# Patient Record
Sex: Male | Born: 1935 | Race: White | Hispanic: No | Marital: Married | Smoking: Current every day smoker
Health system: Southern US, Community
[De-identification: ages and names within clinical notes are randomized; demographics above are authoritative.]

## PROBLEM LIST (undated history)

## (undated) ENCOUNTER — Emergency Department (HOSPITAL_COMMUNITY): Discharge status: Against Medical Advice | Payer: Medicare Other

## (undated) DIAGNOSIS — I10 Essential (primary) hypertension: Secondary | ICD-10-CM

## (undated) DIAGNOSIS — J449 Chronic obstructive pulmonary disease, unspecified: Secondary | ICD-10-CM

## (undated) DIAGNOSIS — M1990 Unspecified osteoarthritis, unspecified site: Secondary | ICD-10-CM

## (undated) DIAGNOSIS — I2510 Atherosclerotic heart disease of native coronary artery without angina pectoris: Secondary | ICD-10-CM

## (undated) DIAGNOSIS — Z992 Dependence on renal dialysis: Secondary | ICD-10-CM

## (undated) DIAGNOSIS — C61 Malignant neoplasm of prostate: Secondary | ICD-10-CM

## (undated) DIAGNOSIS — I499 Cardiac arrhythmia, unspecified: Secondary | ICD-10-CM

## (undated) DIAGNOSIS — I639 Cerebral infarction, unspecified: Secondary | ICD-10-CM

## (undated) DIAGNOSIS — C4490 Unspecified malignant neoplasm of skin, unspecified: Secondary | ICD-10-CM

## (undated) DIAGNOSIS — E785 Hyperlipidemia, unspecified: Secondary | ICD-10-CM

## (undated) DIAGNOSIS — N186 End stage renal disease: Secondary | ICD-10-CM

## (undated) DIAGNOSIS — K219 Gastro-esophageal reflux disease without esophagitis: Secondary | ICD-10-CM

## (undated) DIAGNOSIS — I4891 Unspecified atrial fibrillation: Secondary | ICD-10-CM

## (undated) HISTORY — DX: Hyperlipidemia, unspecified: E78.5

## (undated) HISTORY — DX: Malignant neoplasm of prostate: C61

## (undated) HISTORY — DX: Unspecified osteoarthritis, unspecified site: M19.90

## (undated) HISTORY — DX: Atherosclerotic heart disease of native coronary artery without angina pectoris: I25.10

## (undated) HISTORY — PX: BACK SURGERY: SHX140

## (undated) HISTORY — PX: HERNIA REPAIR: SHX51

## (undated) HISTORY — PX: APPENDECTOMY: SHX54

## (undated) HISTORY — DX: Essential (primary) hypertension: I10

## (undated) HISTORY — PX: AV FISTULA PLACEMENT: SHX1204

---

## 2000-11-03 ENCOUNTER — Ambulatory Visit: Admission: RE | Admit: 2000-11-03 | Discharge: 2000-11-03 | Payer: Self-pay | Admitting: Family Medicine

## 2001-11-20 ENCOUNTER — Ambulatory Visit (HOSPITAL_COMMUNITY): Admission: RE | Admit: 2001-11-20 | Discharge: 2001-11-20 | Payer: Self-pay | Admitting: Family Medicine

## 2001-11-20 ENCOUNTER — Encounter: Payer: Self-pay | Admitting: Family Medicine

## 2005-08-05 ENCOUNTER — Ambulatory Visit: Payer: Self-pay | Admitting: Cardiology

## 2005-09-06 ENCOUNTER — Ambulatory Visit: Payer: Self-pay | Admitting: Cardiology

## 2005-09-13 ENCOUNTER — Ambulatory Visit: Payer: Self-pay | Admitting: Cardiology

## 2005-10-15 ENCOUNTER — Ambulatory Visit: Payer: Self-pay | Admitting: Cardiology

## 2007-01-05 ENCOUNTER — Ambulatory Visit: Payer: Self-pay | Admitting: Cardiology

## 2008-02-28 ENCOUNTER — Ambulatory Visit: Payer: Self-pay | Admitting: Cardiology

## 2008-03-10 ENCOUNTER — Ambulatory Visit: Payer: Self-pay | Admitting: Cardiology

## 2008-03-10 ENCOUNTER — Encounter: Payer: Self-pay | Admitting: Cardiology

## 2008-03-10 DIAGNOSIS — I7389 Other specified peripheral vascular diseases: Secondary | ICD-10-CM | POA: Insufficient documentation

## 2008-03-10 DIAGNOSIS — E785 Hyperlipidemia, unspecified: Secondary | ICD-10-CM

## 2008-03-10 DIAGNOSIS — N184 Chronic kidney disease, stage 4 (severe): Secondary | ICD-10-CM

## 2008-03-10 DIAGNOSIS — I209 Angina pectoris, unspecified: Secondary | ICD-10-CM

## 2008-03-10 DIAGNOSIS — M1990 Unspecified osteoarthritis, unspecified site: Secondary | ICD-10-CM | POA: Insufficient documentation

## 2008-03-10 DIAGNOSIS — D631 Anemia in chronic kidney disease: Secondary | ICD-10-CM | POA: Insufficient documentation

## 2008-03-10 DIAGNOSIS — F17200 Nicotine dependence, unspecified, uncomplicated: Secondary | ICD-10-CM | POA: Insufficient documentation

## 2008-03-10 DIAGNOSIS — I4891 Unspecified atrial fibrillation: Secondary | ICD-10-CM

## 2008-03-10 DIAGNOSIS — Z8546 Personal history of malignant neoplasm of prostate: Secondary | ICD-10-CM

## 2008-03-10 DIAGNOSIS — I10 Essential (primary) hypertension: Secondary | ICD-10-CM

## 2008-03-10 DIAGNOSIS — I5023 Acute on chronic systolic (congestive) heart failure: Secondary | ICD-10-CM

## 2008-03-10 DIAGNOSIS — N039 Chronic nephritic syndrome with unspecified morphologic changes: Secondary | ICD-10-CM

## 2008-03-13 ENCOUNTER — Inpatient Hospital Stay (HOSPITAL_COMMUNITY): Admission: EM | Admit: 2008-03-13 | Discharge: 2008-03-15 | Payer: Self-pay | Admitting: Cardiology

## 2008-03-13 ENCOUNTER — Ambulatory Visit: Payer: Self-pay | Admitting: Cardiology

## 2008-03-14 ENCOUNTER — Ambulatory Visit: Payer: Self-pay | Admitting: Thoracic Surgery (Cardiothoracic Vascular Surgery)

## 2008-03-25 ENCOUNTER — Ambulatory Visit: Payer: Self-pay | Admitting: Cardiology

## 2010-08-10 ENCOUNTER — Ambulatory Visit: Payer: Self-pay | Admitting: Vascular Surgery

## 2010-08-21 NOTE — Consult Note (Signed)
NAME:  Derek Vincent, Derek Vincent NO.:  [id number]   MEDICAL RECORD NO.:  [id number]          PATIENT TYPE:  INP   LOCATION:  3705                         FACILITY:  [facility]   PHYSICIAN:  Salvatore Decent. Dorris Fetch, M.D.DATE OF BIRTH:  1924-03-08   DATE OF CONSULTATION:  03/14/2008  DATE OF DISCHARGE:                                 CONSULTATION   Derek Vincent is an 77 year old gentleman with a history of presumed  coronary artery disease.  He had a non-Q-wave myocardial infarction in  2007.  He was also diagnosed with paroxysmal atrial fibrillation at that  time.  He had a Cardiolite study, which showed no reversible defects.  He did refuse catheterization.  In 2008, the patient had heart failure  with bilateral pleural effusions and markedly elevated BNP.  He also had  a non-ST elevation then.  His creatinine was noted to be 2.9.  In  November of this year, he had episode of bilateral arm and chest pain  associated with elevated troponins.  An echocardiogram showed multiple  regional wall motion abnormalities and a decline in his EF from  essentially normal to about 40%.  He refused cardiac catheterization.  He saw Dr. Andee Lineman in the office on March 10, 2008, who strongly  encouraged him to have cardiac catheterization given his high risk for  significant coronary artery disease.  The patient stated that he had not  been having any chest pain or arm pain since his discharge from the  hospital, but finally consented to have a cardiac catheterization.  He  was admitted yesterday and treated with IV hydration and Mucomyst, and  today he underwent cardiac catheterization, which revealed severe two-  vessel coronary disease with a totally occluded right coronary.  There  is complex LAD diagonal disease involving the takeoff of a large first  diagonal branch.   PAST MEDICAL HISTORY:  Significant for presumed coronary artery disease,  paroxysmal atrial fibrillation.  He has refused  Coumadin.  Chronic  kidney disease with a creatinine of 2.9 to 3, dyslipidemia,  hypertension, degenerative joint disease, prostate cancer, and COPD with  ongoing tobacco abuse.   PAST SURGICAL HISTORY:  Significant for appendectomy, hernia repair, and  back surgery.   MEDICATIONS ON ADMISSION:  1. Imdur 60 mg daily.  2. Benicar 40 mg daily.  3. Lasix 40 mg daily.  4. Lipitor 80 mg daily.  5. Plavix 75 mg daily.  6. Toprol-XL 50 mg b.i.d.  7. Aspirin 325 mg daily.  8. K-Dur 10 mEq daily.   ALLERGIES:  He has no known drug allergies.   FAMILY HISTORY:  Significant for coronary artery disease.   SOCIAL HISTORY:  The patient is retired.  He lives at [place] with  his wife.  He states he has been smoking for 72 years about a pack a  day.   REVIEW OF SYSTEMS:  The patient denies any knowledge of kidney disease  prior to this admission.  He denies any chest pain or arm pain or  shortness of breath.  Since his last admission, he does get tired in  his  legs with walking.   PHYSICAL EXAMINATION:  GENERAL:  Derek Vincent is an elderly gentleman, in  no acute distress.  He is well developed and well nourished.  VITAL SIGNS:  Blood pressure is 135/91, pulse is 111 and irregular,  respirations are 16, he is afebrile, and his room air oxygen saturation  is 93%.  NEUROLOGIC:  He is alert and oriented with no focal deficits.  HEENT:  Unremarkable.  NECK:  Supple without thyromegaly, adenopathy, or bruits.  CARDIAC:  Irregularly irregular.  There is no rub or murmur.  LUNGS:  Coarse breath sounds bilaterally.  ABDOMEN:  Soft and nontender.  EXTREMITIES:  No palpable distal pulses in the lower extremities.  There  is no peripheral edema.   LABORATORY DATA:  His hematocrit is 32, white count 7.8, and platelets  242.  Sodium is 141, potassium 4.1, CO2 is 26, BUN 36, creatinine 3.05,  and glucose is 100.  His PT is 13.6 and his PTT was 32.  Liver function  tests within normal limits.   Albumin 3.0.  His cholesterol is 137.  His  HDL is low at 27.  His triglycerides are 91.  Cardiac catheterization  films were reviewed.   IMPRESSION:  Derek Vincent is an 77 year old gentleman recently admitted  with unstable coronary syndrome.  He was advised of cardiac  catheterization at that time, but refused.  He now presents for elective  cardiac catheterization, was found to have severe two-vessel coronary  disease with totally occluded right coronary and complex disease with  bifurcation of LAD and a large first diagonal.  He has not had left  ventricular function assessed during this admission, but recently had an  echocardiogram, which showed an ejection fraction of 40%.  Unfortunately, we did not have the records from his previous  hospitalization available at this time.  Derek Vincent would be an  extremely high risk surgical patient given his advanced age, his  advanced chronic kidney disease, longstanding history of heavy tobacco  abuse, and peripheral arterial disease.  I discussed with him the  indications, risks, benefits, and alternative treatments.  He  understands the risks of surgery include, but not limited to death,  stroke, MI, DVT, PE, bleeding, possible need transfusions, infections as  well as other organ system dysfunction including respiratory, renal, or  GI complications.  He certainly would be at high-risk even with off-pump  grafting for significant respiratory or renal complications or stroke  given his multiple risk factors.  There would certainly be an extremely  high risk of need for dialysis even if he was done off pump.  In my  opinion, he would not be a candidate at all for on-pump grafting with  consideration for Maze procedure at the same time.   Derek Vincent does not wish to have coronary bypass grafting at this time,  in fact, he really will not even discuss it.  He is currently stable.  I  will plan to meet with him again tomorrow to make sure that he  has a  clear understanding of the issues involved.      Salvatore Decent Dorris Fetch, M.D.  Electronically Signed     SCH/MEDQ  D:  03/14/2008  T:  03/15/2008  Job:  [id number]   cc:   Gwynneth Munson, MD,FACC

## 2010-08-21 NOTE — Discharge Summary (Signed)
NAME:  Derek Vincent, Derek Vincent NO.:  [id number]   MEDICAL RECORD NO.:  [id number]          PATIENT TYPE:  INP   LOCATION:  3705                         FACILITY:  [facility]   PHYSICIAN:  Derek Codding, MD,FACC DATE OF BIRTH:  [birth year]   DATE OF ADMISSION:  03/13/2008  DATE OF DISCHARGE:  03/15/2008                               DISCHARGE SUMMARY   Primary cardiologist:  Dr. Charyl Dancer   Primary care Raniya Golembeski:  Dr. Neita Carp.   DISCHARGE DIAGNOSIS:  Unstable angina.   SECONDARY DIAGNOSES:  1. Coronary artery disease, status post catheterization this      admission.  2. Chronic kidney disease.  3. Chronic systolic congestive heart failure.  4. Paroxysmal atrial fibrillation, refusing Coumadin therapy.  5. Hyperlipidemia.  6. Remote tobacco abuse.  7. Peripheral vascular disease with claudication.  8. Hypertension.  9. Anemia of chronic disease.  10.History of prostate cancer.  11.Degenerative joint disease.  12.Status post appendectomy, hernia repair, and back surgery.   ALLERGIES:  No known drug allergies.   PROCEDURE:  Left heart cardiac catheterization on 03/14/2008 revealing  an occluded right coronary artery, 80% stenosis in the proximal LAD, 90%  stenosis in the ostial first diagonal, and nonobstructive disease in the  left circumflex.  The distal right coronary filled via left to right  collaterals.   HISTORY OF PRESENT ILLNESS:  An 77 year old male with prior history of  non-ST elevation MI in 2007 at which time he refused cardiac  catheterization.  He also has a history of paroxysmal atrial  fibrillation and has previously refused Coumadin as well.  The patient  was recently admitted to [facility] in November of this year  with complaints of recurrent chest and bilateral arm pain and was noted  to have elevated cardiac troponins ruling in for non-ST elevation MI.  Echocardiogram at the time revealed an EF of 40% with multiple wall  motion abnormalities  suggestive of multivessel or left main disease.  It  was recommended at that time that the patient undergo catheterization  but again he refused.  He was managed medically and subsequently  followed up by his primary care Toi Stelly and after discussion, the  decision was made to pursue catheterization.  He then followed up with  Dr. Andee Lineman in clinic on 12/04 and arrangements were made for admission  for hydration and subsequent catheterization.   HOSPITAL COURSE:  The patient was admitted on the evening of 12/06 for  IV hydration.  His creatinine on 12/07 was 3.05 following hydration.  Cardiac catheterization took place 12/07 using minimal contrast and  revealing significant multivessel disease as outlined above.  It was  felt that the patient would benefit best with thoracic surgery  consultation and perhaps cardiothoracic bypass surgery understanding of  course this would be very high risk.  He was seen by Dr. Dorris Fetch  with thoracic surgery and the patient refused surgery.  We then  discussed the possibility of percutaneous intervention to the proximal  LAD, however, the patient also refused this and demanded to go home.  We  recommended hydrating him today (creatinine came down  to 2.63 this  morning), however, the patient refused any additional IV fluids.  He is  being discharged home today on medical therapy.   DISCHARGE LABORATORY DATA:  Hemoglobin 11.3, hematocrit 33.2, WBCs 7.4,  platelets 244, MCV 94.4.  Sodium 139, potassium 4.5, chloride 100, CO2  31, BUN 39, creatinine 2.63, glucose 96.  Total bilirubin 0.6, alkaline  phosphatase 63, AST 16, ALT 10, albumin 3.0.  Total cholesterol 137,  triglycerides 91, HDL 27, LDL 92.  Calcium 8.5.   DISPOSITION:  The patient will be discharged home today in good  condition.   FOLLOW-UP PLANS AND APPOINTMENTS:  We have arranged for followup with  Anastasia Fiedler, PA-C in our evening clinic on 12/22 at 1:30 p.m.  We have  asked him to  follow up with Dr. Neita Carp as previously scheduled.   DISCHARGE MEDICATIONS:  Aspirin 325 mg daily, Imdur 60 mg daily, Benicar  40 mg daily, Lasix 40 mg daily, Lipitor 80 mg daily, Plavix 75 mg daily,  Toprol XL 100 mg b.i.d., K-Dur 10 mEq daily, amlodipine 5 mg daily,  nitroglycerin 0.4 mg sublingual p.r.n. chest pain.   Outstanding lab studies:  The patient should have a B-Met within the  week.   Duration of discharge encounter:  45 minutes including physician time.      Nicolasa Ducking, ANP      Derek Codding, MD,FACC  Electronically Signed    CB/MEDQ  D:  03/15/2008  T:  03/15/2008  Job:  [id number]   cc:   Fara Chute

## 2010-08-21 NOTE — Cardiovascular Report (Signed)
NAME:  Derek Vincent, Derek Vincent NO.:  [id number]   MEDICAL RECORD NO.:  [id number]          PATIENT TYPE:  INP   LOCATION:  3705                         FACILITY:  [facility]   PHYSICIAN:  Marca Ancona, MD      DATE OF BIRTH:  [birth year]   DATE OF PROCEDURE:  DATE OF DISCHARGE:                            CARDIAC CATHETERIZATION   INDICATIONS:  History of non-ST elevation MI and congestive heart  failure with LV-systolic dysfunction.   PROCEDURE:  1. Left heart catheterization.  2. Coronary angiography.  3. Left ventriculography, only pressures were measured.   PROCEDURE NOTE:  After informed consent was obtained, the right groin  was sterilely prepped and draped and 1% lidocaine was used to locally  anesthetize the right groin area.  The right, femoral artery was engaged  using Seldinger technique and a 6 French arterial sheath was placed.  The wire was passed with difficulty beyond the iliac bifurcation into  the abdominal and thoracic aortas.  The 6-French JL4 catheter was used  to engage the left coronary artery.  The 6 [place] JR 4 catheter was used  to engage the right coronary artery and the left ventricle was entered  using the 6 French angled pigtail catheter.   FINDINGS:  1. Hemodynamics.  Aorta 200/114,  LV 171/9/14.  This LV pressure      reading is post Lopressor and hydralazine for high blood pressure.      No ventriculogram was done due to renal failure.  The prior EF was      known to be about 40% by echocardiogram.  2. Coronary angiography.  The coronary system is right dominant.  The      RCA is totally occluded proximally.  The PDA and distal RCA are      filled by left-to-right collaterals.  The left main has only      luminal irregularities.  The circumflex coronary artery has a 30%      stenosis proximally and a 40% stenosis in the mid-vessel after the      first obtuse marginal.  The ramus has no significant disease and is      a small vessel.  There  is an 80% proximal LAD stenosis at the site      of the take-off of the large first diagonal.  This lesion also      involves the ostium of the first diagonal which has approximately      90% stenosis.  The remainder of the LAD has luminal irregularities.      Of note, the iliac bifurcation and distal aorta were difficult to      cross with the wire.  There is likely significant atherosclerotic      disease in this area.  However, no angiography was done in the      abdominal aorta due to the need to limit contrast.  Additionally,      the tortuous iliacs and aorta with significant atherosclerotic      disease made engagement of the coronaries difficult.  Therefore, we      ended up using  about 70 mL of contrast total.   ASSESSMENT AND PLAN:  There is a totally occluded RCA with left-to-right  collaterals filling the PDA.  There is a proximal LAD bifurcation lesion  with about an 80% proximal LAD stenosis and 90% ostial diagonal  stenosis.  Additionally, there is severe chronic kidney disease and  poorly controlled hypertension.  Based on the coronary anatomy, I would  say that the patient probably would be best served by coronary artery  bypass grafting.  However, the patient would be at a high risk fpr  complications, especially given his chronic kidney disease.  I will have  cardio-thoracic surgery evaluate and render an opinion on his management  today.  We will hold his Plavix for now.  Regarding his renal failure, we will continue him on a bicarbonate drip  and Mucomyst.  The patient was informed beforehand of the risk of post  contract nephropathy, which is especially high in him, given his  baseline chronic kidney disease.  He was warned of the possibility of  the need to go on dialysis.      Marca Ancona, MD  Electronically Signed     DM/MEDQ  D:  03/14/2008  T:  03/15/2008  Job:  [id number]   cc:   Learta Codding, MD,FACC

## 2010-08-24 NOTE — Assessment & Plan Note (Signed)
[facility]                            [facility] OFFICE NOTE   NAME:Derek Vincent, Derek Vincent                     MRN:          [id number]  DATE:10/15/2005                            DOB:          12-27-1923    REFERRING PHYSICIAN:  Clarene Critchley Vincent   REASON FOR OFFICE VISIT:  Derek Vincent returns for a scheduled followup here.  Please refer to Derek Vincent office note of September 06, 2005 for full details.   At that time, patient presented for post hospital followup, after being  recently treated for a new onset PAF with RVR.  Additionally, he ruled in  for non-ST elevation myocardial infarction with abnormal cardiac markers  (troponin 1.2), but declined cardiac catheterization.  Left ventricular  function was normal by echocardiography and a post hospital stress test was  negative for ischemia with presence of a non-reversible defect in the  inferior basal segment; calculated ejection fracture 50%.   Patient was also advised regarding risk of stroke, but declined to be placed  on Coumadin as well.  He is on a full dose aspirin.  Of note, patient did  spontaneously convert to normal sinus while in the hospital and has been  maintaining NSR since then.   Patient today reports no symptoms of either chest pain, tachy palpitations,  or dyspnea.  He reports compliance with his medications.  His ACE inhibitor  was recently up titrated  and a followup BMET showed mild renal  insufficiency with a creatinine of 1.4.   Current medications aspirin 325 q.d.  Hydrochlorothiazide 25 q.d.  Lipitor  10 q.d., Enalapril l 20 q.d., bisoprolol 5 q.d.   PHYSICAL EXAMINATION:  VITAL SIGNS:  Blood pressure 140/70.  Pulse 70  regular.  Weight 200.  GENERAL:  An 77 year-old-male sitting upright, no current distress.  NECK:  Palpable with carotid pulses without bruits; no JVD.  LUNGS:  Clear to auscultation fields.  HEART:  Regular rate and rhythm (S1/S2), no significant  murmurs.  ABDOMEN:  Soft, non-tender.  EXTREMITIES:  No significant edema.   Electrocardiogram today reveals normal sinus rhythm of 63 BPM with left axis  deviation and persistent small T wave inversion in leads V5 and V6.   IMPRESSION:  1.  Presumed coronary artery disease - stable.      1.  Patient has refused cardiac catheterization.      2.  Non-ischemic Adenosine Cardiolite; ejection fraction of 50% in May          2007.  2.  Normal left ventricular function.  3.  Status post paroxysmal atrial fibrillation with rapid ventricular      response (new onset).      1.  Spontaneous conversion to normal sinus rhythm.      2.  Patient refused to be placed on Coumadin.      3.  Maintaining a normal sinus rhythm.  4.  Mild renal sufficiency.  5.  Hyperlipidemia.  6.  Hypertension.  7.  Non-compliance.   PLAN:  At this point, I recommended a repeat BMET for continued close  monitoring of mild  renal sufficiency.  However, he declined to have his  blood drawn today.  We will therefore make no additional recommendations and  continue him on his current medication regimen.  We will plan on having him  return to the clinic to resume followup with Dr. Lewayne Vincent in approximately  three months.                                   Derek Serpe, PA-C   GS/MedQ  DD:  10/15/2005  DT:  10/15/2005  Job #:  [id number]   cc:   Derek Vincent

## 2010-09-12 DIAGNOSIS — I509 Heart failure, unspecified: Secondary | ICD-10-CM

## 2010-09-12 DIAGNOSIS — R079 Chest pain, unspecified: Secondary | ICD-10-CM

## 2010-09-13 DIAGNOSIS — I200 Unstable angina: Secondary | ICD-10-CM

## 2010-09-19 ENCOUNTER — Encounter: Payer: Self-pay | Admitting: Cardiovascular Disease

## 2010-10-19 ENCOUNTER — Encounter: Payer: Medicare Other | Admitting: Cardiovascular Disease

## 2010-10-26 ENCOUNTER — Encounter: Payer: Medicare Other | Admitting: Cardiovascular Disease

## 2010-10-30 ENCOUNTER — Encounter: Payer: Medicare Other | Admitting: Cardiology

## 2010-10-31 DIAGNOSIS — I2510 Atherosclerotic heart disease of native coronary artery without angina pectoris: Secondary | ICD-10-CM

## 2010-11-08 ENCOUNTER — Other Ambulatory Visit (HOSPITAL_COMMUNITY): Payer: Self-pay | Admitting: Nephrology

## 2010-11-08 DIAGNOSIS — N186 End stage renal disease: Secondary | ICD-10-CM

## 2010-11-09 ENCOUNTER — Ambulatory Visit (HOSPITAL_COMMUNITY)
Admission: RE | Admit: 2010-11-09 | Discharge: 2010-11-09 | Disposition: A | Discharge status: Home / Self Care | Payer: Medicare Other | Source: Ambulatory Visit | Attending: Nephrology | Admitting: Nephrology

## 2010-11-09 ENCOUNTER — Other Ambulatory Visit (HOSPITAL_COMMUNITY): Payer: Self-pay | Admitting: Nephrology

## 2010-11-09 DIAGNOSIS — D649 Anemia, unspecified: Secondary | ICD-10-CM | POA: Insufficient documentation

## 2010-11-09 DIAGNOSIS — I70209 Unspecified atherosclerosis of native arteries of extremities, unspecified extremity: Secondary | ICD-10-CM | POA: Insufficient documentation

## 2010-11-09 DIAGNOSIS — N186 End stage renal disease: Secondary | ICD-10-CM | POA: Insufficient documentation

## 2010-11-09 DIAGNOSIS — Z87891 Personal history of nicotine dependence: Secondary | ICD-10-CM | POA: Insufficient documentation

## 2010-11-09 DIAGNOSIS — Y841 Kidney dialysis as the cause of abnormal reaction of the patient, or of later complication, without mention of misadventure at the time of the procedure: Secondary | ICD-10-CM | POA: Insufficient documentation

## 2010-11-09 DIAGNOSIS — I252 Old myocardial infarction: Secondary | ICD-10-CM | POA: Insufficient documentation

## 2010-11-09 DIAGNOSIS — M1990 Unspecified osteoarthritis, unspecified site: Secondary | ICD-10-CM | POA: Insufficient documentation

## 2010-11-09 DIAGNOSIS — I4891 Unspecified atrial fibrillation: Secondary | ICD-10-CM | POA: Insufficient documentation

## 2010-11-09 DIAGNOSIS — I120 Hypertensive chronic kidney disease with stage 5 chronic kidney disease or end stage renal disease: Secondary | ICD-10-CM | POA: Insufficient documentation

## 2010-11-09 DIAGNOSIS — C61 Malignant neoplasm of prostate: Secondary | ICD-10-CM | POA: Insufficient documentation

## 2010-11-09 DIAGNOSIS — T82898A Other specified complication of vascular prosthetic devices, implants and grafts, initial encounter: Secondary | ICD-10-CM | POA: Insufficient documentation

## 2010-11-09 DIAGNOSIS — I2510 Atherosclerotic heart disease of native coronary artery without angina pectoris: Secondary | ICD-10-CM | POA: Insufficient documentation

## 2010-11-09 DIAGNOSIS — I509 Heart failure, unspecified: Secondary | ICD-10-CM | POA: Insufficient documentation

## 2010-11-09 HISTORY — PX: OTHER SURGICAL HISTORY: SHX169

## 2010-11-22 ENCOUNTER — Encounter: Payer: Self-pay | Admitting: Vascular Surgery

## 2010-12-17 ENCOUNTER — Encounter: Payer: Self-pay | Admitting: Vascular Surgery

## 2010-12-18 ENCOUNTER — Encounter: Payer: Self-pay | Admitting: Vascular Surgery

## 2010-12-18 ENCOUNTER — Ambulatory Visit (INDEPENDENT_AMBULATORY_CARE_PROVIDER_SITE_OTHER): Payer: Medicare Other | Admitting: *Deleted

## 2010-12-18 ENCOUNTER — Ambulatory Visit (INDEPENDENT_AMBULATORY_CARE_PROVIDER_SITE_OTHER): Payer: Medicare Other | Admitting: Vascular Surgery

## 2010-12-18 VITALS — BP 132/60 | HR 64 | Temp 97.8°F | Ht 71.0 in | Wt 216.0 lb

## 2010-12-18 DIAGNOSIS — N186 End stage renal disease: Secondary | ICD-10-CM

## 2010-12-18 DIAGNOSIS — Z01810 Encounter for preprocedural cardiovascular examination: Secondary | ICD-10-CM

## 2010-12-18 NOTE — Progress Notes (Signed)
Subjective:     Patient ID: Derek Sheng Sr., male   DOB: March 11, 1924, 77 y.o.   MRN: [id number]  HPI this 77 year old right-handed male patient was referred for vascular access evaluation. He recently had a temporary dialysis catheter placed by the radiologist. It is in the right internal jugular vein. He denies any claudication symptoms in either upper extremity and no pain in either hand. He does not know the etiology of his renal failure. He dialyzes in [place].  Past Medical History  Diagnosis Date  . CAD (coronary artery disease)     presumed. LV function normal in 07, did decline to 30-40% in 08 when presented w/cCHF. paroxysmal a-fib w/repaid ventricular response on 2007 and during his current admission (as maintaining currently normal sinus rhythm pt refused Coumadin in the past)  . Chronic renal insufficiency     creatinine varying 2.4-2.9  . Dyslipidemia   . HTN (hypertension)   . Prostate cancer   . DJD (degenerative joint disease)     History  Substance Use Topics  . Smoking status: Current Everyday Smoker -- 0.5 packs/day for 75 years    Types: Cigarettes  . Smokeless tobacco: Not on file   Comment: 71 pack -year history   . Alcohol Use: No     noalcohol in last 20 years     Family History  Problem Relation Age of Onset  . Coronary artery disease      family hx    No Known Allergies  Current outpatient prescriptions:amLODipine (NORVASC) 10 MG tablet, Take 10 mg by mouth daily.  , Disp: , Rfl: ;  aspirin 325 MG tablet, Take 325 mg by mouth daily.  , Disp: , Rfl: ;  atorvastatin (LIPITOR) 80 MG tablet, Take 80 mg by mouth daily.  , Disp: , Rfl: ;  cloNIDine (CATAPRES) 0.2 MG tablet, Take 0.2 mg by mouth 2 (two) times daily.  , Disp: , Rfl: ;  clopidogrel (PLAVIX) 75 MG tablet, Take 75 mg by mouth daily.  , Disp: , Rfl:  isosorbide mononitrate (IMDUR) 60 MG 24 hr tablet, Take 60 mg by mouth daily.  , Disp: , Rfl: ;  lisinopril (PRINIVIL,ZESTRIL) 10 MG tablet, Take 10 mg  by mouth daily.  , Disp: , Rfl: ;  multivitamin (RENA-VIT) TABS tablet, Take 1 tablet by mouth daily.  , Disp: , Rfl: ;  pantoprazole (PROTONIX) 40 MG tablet, Take 40 mg by mouth daily.  , Disp: , Rfl: ;  sevelamer (RENVELA) 800 MG tablet, Take 1,600 mg by mouth daily.  , Disp: , Rfl:  furosemide (LASIX) 40 MG tablet, Take 40 mg by mouth daily.  , Disp: , Rfl: ;  metoprolol (TOPROL-XL) 50 MG 24 hr tablet, Take 50 mg by mouth. Twice daily on Tuesday, Thursday, Saturday and sunday, Disp: , Rfl: ;  olmesartan (BENICAR) 40 MG tablet, Take 40 mg by mouth daily.  , Disp: , Rfl: ;  potassium chloride (K-DUR) 10 MEQ tablet, Take 10 mEq by mouth daily.  , Disp: , Rfl:   BP 132/60  Pulse 64  Temp(Src) 97.8 F (36.6 C) (Oral)  Ht 5\' 11"  (1.803 m)  Wt 216 lb (97.977 kg)  BMI 30.13 kg/m2  Body mass index is 30.13 kg/(m^2).        Review of Systems he resides in a nursing home. He denies chest pain dyspnea on exertion PND orthopnea chronic bronchitis or any neurologic symptoms. All this systems the complete review of systems are negative.  Objective:   Physical Exam blood pressure 130/60 heart rate 64 respirations 14 temperature 97.8 Gen. he is an elderly male who is in a wheelchair he is in no apparent distress alert and oriented x3 HEENT exam normal for age Lungs clear to auscultation no rhonchi or wheezing Cardiovascular exam regular rhythm no murmurs Abdomen soft nontender with no masses Neurologic exam normal Musculoskeletal exam free major deformities Skin free of rashes Upper extremity exam reveals 3+ brachial radial pulses bilaterally. Superficial veins appeared to be adequate in the left upper extremity in the cephalic system  Today I ordered bilateral upper extremity vein mapping which are reviewed and interpreted. Both upper arm cephalic systems appear adequate. Since he is right-handed we will proceed with the left upper extremity brachial cephalic fistula    Assessment:     end-stage renal disease needs access. Dialyzes Monday Wednesday and Friday.    Plan:     Plan left upper arm brachial artery to cephalic vein AV fistula on Friday, September 14. We will talk to dialysis unit about adjusting his normal Friday dialysis session. Risks and benefits have been thoroughly discussed.

## 2010-12-21 ENCOUNTER — Ambulatory Visit (HOSPITAL_COMMUNITY): Payer: Medicare Other

## 2010-12-21 ENCOUNTER — Ambulatory Visit (HOSPITAL_COMMUNITY)
Admission: RE | Admit: 2010-12-21 | Discharge: 2010-12-21 | Disposition: A | Discharge status: Home / Self Care | Payer: Medicare Other | Source: Ambulatory Visit | Attending: Vascular Surgery | Admitting: Vascular Surgery

## 2010-12-21 DIAGNOSIS — I120 Hypertensive chronic kidney disease with stage 5 chronic kidney disease or end stage renal disease: Secondary | ICD-10-CM | POA: Insufficient documentation

## 2010-12-21 DIAGNOSIS — I509 Heart failure, unspecified: Secondary | ICD-10-CM | POA: Insufficient documentation

## 2010-12-21 DIAGNOSIS — Z01810 Encounter for preprocedural cardiovascular examination: Secondary | ICD-10-CM | POA: Insufficient documentation

## 2010-12-21 DIAGNOSIS — N186 End stage renal disease: Secondary | ICD-10-CM | POA: Insufficient documentation

## 2010-12-21 DIAGNOSIS — Z992 Dependence on renal dialysis: Secondary | ICD-10-CM | POA: Insufficient documentation

## 2010-12-21 DIAGNOSIS — Z79899 Other long term (current) drug therapy: Secondary | ICD-10-CM | POA: Insufficient documentation

## 2010-12-21 DIAGNOSIS — M1990 Unspecified osteoarthritis, unspecified site: Secondary | ICD-10-CM | POA: Insufficient documentation

## 2010-12-21 DIAGNOSIS — Z7982 Long term (current) use of aspirin: Secondary | ICD-10-CM | POA: Insufficient documentation

## 2010-12-21 DIAGNOSIS — Z01818 Encounter for other preprocedural examination: Secondary | ICD-10-CM | POA: Insufficient documentation

## 2010-12-21 DIAGNOSIS — Z01812 Encounter for preprocedural laboratory examination: Secondary | ICD-10-CM | POA: Insufficient documentation

## 2010-12-21 DIAGNOSIS — E785 Hyperlipidemia, unspecified: Secondary | ICD-10-CM | POA: Insufficient documentation

## 2010-12-21 DIAGNOSIS — I2510 Atherosclerotic heart disease of native coronary artery without angina pectoris: Secondary | ICD-10-CM | POA: Insufficient documentation

## 2010-12-21 DIAGNOSIS — Z8546 Personal history of malignant neoplasm of prostate: Secondary | ICD-10-CM | POA: Insufficient documentation

## 2010-12-21 DIAGNOSIS — F17200 Nicotine dependence, unspecified, uncomplicated: Secondary | ICD-10-CM | POA: Insufficient documentation

## 2010-12-21 LAB — SURGICAL PCR SCREEN: MRSA, PCR: POSITIVE — AB

## 2010-12-24 LAB — POCT I-STAT 4, (NA,K, GLUC, HGB,HCT)
Glucose, Bld: 104 mg/dL — ABNORMAL HIGH (ref 70–99)
Hemoglobin: 15.3 g/dL (ref 13.0–17.0)
Potassium: 4.2 mEq/L (ref 3.5–5.1)

## 2010-12-24 NOTE — Op Note (Signed)
  NAME:  Derek Vincent, Derek Vincent NO.:  [id number]  MEDICAL RECORD NO.:  [id number]  LOCATION:  [facility]                         FACILITY:  [facility]  PHYSICIAN:  Quita Skye. Hart Rochester, M.D.  DATE OF BIRTH:  October 02, 1923  DATE OF PROCEDURE:  12/21/2010 DATE OF DISCHARGE:  12/21/2010                              OPERATIVE REPORT   PREOPERATIVE DIAGNOSIS:  End-stage renal disease.  POSTOPERATIVE DIAGNOSIS:  End-stage renal disease.  OPERATION:  Creation of a left brachial to artery to cephalic vein, AV fistula (Kauffman shunt).  SURGEON:  Quita Skye. Hart Rochester, MD  FIRST ASSISTANT:  Della Goo, PA-C  ANESTHESIA:  MAC.  PROCEDURE:  The patient was taken to the operating room, placed in supine position at which time left upper extremity was prepped with Betadine scrub and solution, draped in routine sterile manner.  After infiltration of 1 % Xylocaine with Epinephrine, transverse incision was made in the antecubital area.  Antecubital vein dissected free.  Basilic branch was carefully preserved.  The cephalic branch ligated at its junction with the basilic.  It was gently dilated with heparinized saline.  It was about a 3-1/2-mm vein, was of good quality.  Brachial artery exposed beneath the fascia, encircled with vessel loops and occluded proximally and distally temporarily.  It was opened with #15 blade, extended with Potts scissors.  No heparin was given.  Vein was carefully measured, spatulated, and anastomosed end-to-side with 6-0 Prolene.  Vessel loops released and there was good pulse and thrill in the fistula with excellent flow up to the shoulder level.  There was diminution of radial and ulnar flow with the fistula open which improved with the fistula occluded.  Adequate hemostasis was then achieved, wound closed in layers with Vicryl in a subcuticular fashion.  Sterile dressing applied.  The patient was taken to recovery in satisfactory condition.     Quita Skye Hart Rochester,  M.D.     JDL/MEDQ  D:  12/21/2010  T:  12/21/2010  Job:  [id number]  Electronically Signed by Josephina Gip M.D. on 12/24/2010 02:16:50 PM

## 2010-12-25 NOTE — Procedures (Unsigned)
CEPHALIC VEIN MAPPING  INDICATION:  Preoperative vein mapping.  HISTORY:  EXAM: The right cephalic vein is compressible.  Diameter measurements range from 0.2 to 0.73 cm.  The right basilic vein is compressible.  Diameter measurements ranging from 0.28 to 0.61 cm.  The left cephalic vein is compressible.  Diameter measurements range from 0.37 to 0.50 cm.  The left basilic vein is compressible.  Diameter measurements range from 0.28 to 0.64 cm.  See attached worksheet for all measurements.  IMPRESSION:  Patent bilateral cephalic and basilic veins with diameter measurements as described above.  ___________________________________________ Quita Skye. Hart Rochester, M.D.  CH/MEDQ  D:  12/19/2010  T:  12/19/2010  Job:  [id number]

## 2011-01-05 ENCOUNTER — Other Ambulatory Visit (HOSPITAL_COMMUNITY): Payer: Self-pay | Admitting: Nephrology

## 2011-01-05 ENCOUNTER — Ambulatory Visit (HOSPITAL_COMMUNITY)
Admission: RE | Admit: 2011-01-05 | Discharge: 2011-01-05 | Disposition: A | Discharge status: Home / Self Care | Payer: Medicare Other | Source: Ambulatory Visit | Attending: Nephrology | Admitting: Nephrology

## 2011-01-05 DIAGNOSIS — N186 End stage renal disease: Secondary | ICD-10-CM

## 2011-01-05 DIAGNOSIS — T82898A Other specified complication of vascular prosthetic devices, implants and grafts, initial encounter: Secondary | ICD-10-CM | POA: Insufficient documentation

## 2011-01-05 DIAGNOSIS — Y849 Medical procedure, unspecified as the cause of abnormal reaction of the patient, or of later complication, without mention of misadventure at the time of the procedure: Secondary | ICD-10-CM | POA: Insufficient documentation

## 2011-01-11 LAB — CBC
HCT: 31.9 % — ABNORMAL LOW (ref 39.0–52.0)
HCT: 33.2 % — ABNORMAL LOW (ref 39.0–52.0)
Hemoglobin: 10.6 g/dL — ABNORMAL LOW (ref 13.0–17.0)
Hemoglobin: 11.3 g/dL — ABNORMAL LOW (ref 13.0–17.0)
MCHC: 33.4 g/dL (ref 30.0–36.0)
MCHC: 34.1 g/dL (ref 30.0–36.0)
MCV: 94.4 fL (ref 78.0–100.0)
MCV: 95.2 fL (ref 78.0–100.0)
MCV: 95.6 fL (ref 78.0–100.0)
Platelets: 242 10*3/uL (ref 150–400)
Platelets: 244 10*3/uL (ref 150–400)
Platelets: 249 10*3/uL (ref 150–400)
RBC: 3.32 MIL/uL — ABNORMAL LOW (ref 4.22–5.81)
RBC: 3.33 MIL/uL — ABNORMAL LOW (ref 4.22–5.81)
RBC: 3.52 MIL/uL — ABNORMAL LOW (ref 4.22–5.81)
RDW: 15.9 % — ABNORMAL HIGH (ref 11.5–15.5)
RDW: 16.1 % — ABNORMAL HIGH (ref 11.5–15.5)
WBC: 7.3 10*3/uL (ref 4.0–10.5)
WBC: 7.4 10*3/uL (ref 4.0–10.5)
WBC: 7.8 10*3/uL (ref 4.0–10.5)

## 2011-01-11 LAB — COMPREHENSIVE METABOLIC PANEL WITH GFR
ALT: 10 U/L (ref 0–53)
AST: 16 U/L (ref 0–37)
Albumin: 3 g/dL — ABNORMAL LOW (ref 3.5–5.2)
Alkaline Phosphatase: 63 U/L (ref 39–117)
BUN: 33 mg/dL — ABNORMAL HIGH (ref 6–23)
CO2: 26 meq/L (ref 19–32)
Calcium: 8.3 mg/dL — ABNORMAL LOW (ref 8.4–10.5)
Chloride: 109 meq/L (ref 96–112)
Creatinine, Ser: 2.95 mg/dL — ABNORMAL HIGH (ref 0.4–1.5)
GFR calc non Af Amer: 21 mL/min — ABNORMAL LOW
Glucose, Bld: 105 mg/dL — ABNORMAL HIGH (ref 70–99)
Potassium: 4.3 meq/L (ref 3.5–5.1)
Sodium: 142 meq/L (ref 135–145)
Total Bilirubin: 0.6 mg/dL (ref 0.3–1.2)
Total Protein: 6 g/dL (ref 6.0–8.3)

## 2011-01-11 LAB — BASIC METABOLIC PANEL
CO2: 26 mEq/L (ref 19–32)
CO2: 31 mEq/L (ref 19–32)
Calcium: 8.5 mg/dL (ref 8.4–10.5)
Calcium: 8.5 mg/dL (ref 8.4–10.5)
Chloride: 107 mEq/L (ref 96–112)
Creatinine, Ser: 3.05 mg/dL — ABNORMAL HIGH (ref 0.4–1.5)
GFR calc Af Amer: 24 mL/min — ABNORMAL LOW (ref >60)
GFR calc Af Amer: 28 mL/min — ABNORMAL LOW (ref >60)
GFR calc non Af Amer: 23 mL/min — ABNORMAL LOW (ref >60)
Potassium: 4.5 mEq/L (ref 3.5–5.1)
Sodium: 139 mEq/L (ref 135–145)
Sodium: 141 mEq/L (ref 135–145)

## 2011-01-11 LAB — HEPARIN LEVEL (UNFRACTIONATED)

## 2011-01-11 LAB — DIFFERENTIAL
Basophils Absolute: 0.1 10*3/uL (ref 0.0–0.1)
Basophils Relative: 1 % (ref 0–1)
Eosinophils Absolute: 0.3 10*3/uL (ref 0.0–0.7)
Eosinophils Relative: 4 % (ref 0–5)
Lymphocytes Relative: 20 % (ref 12–46)
Lymphs Abs: 1.4 10*3/uL (ref 0.7–4.0)
Monocytes Absolute: 0.6 10*3/uL (ref 0.1–1.0)
Monocytes Relative: 8 % (ref 3–12)
Neutro Abs: 5 10*3/uL (ref 1.7–7.7)
Neutrophils Relative %: 68 % (ref 43–77)

## 2011-01-11 LAB — LIPID PANEL
Triglycerides: 91 mg/dL (ref <150)
VLDL: 18 mg/dL (ref 0–40)

## 2011-01-11 LAB — PROTIME-INR
INR: 1 (ref 0.00–1.49)
Prothrombin Time: 13.6 s (ref 11.6–15.2)

## 2011-01-21 ENCOUNTER — Encounter: Payer: Self-pay | Admitting: Vascular Surgery

## 2011-01-22 ENCOUNTER — Encounter: Payer: Self-pay | Admitting: Vascular Surgery

## 2011-01-22 ENCOUNTER — Ambulatory Visit (INDEPENDENT_AMBULATORY_CARE_PROVIDER_SITE_OTHER): Payer: Medicare Other | Admitting: Vascular Surgery

## 2011-01-22 VITALS — BP 107/44 | HR 60 | Resp 20 | Ht 71.0 in | Wt 218.0 lb

## 2011-01-22 DIAGNOSIS — N186 End stage renal disease: Secondary | ICD-10-CM

## 2011-01-22 NOTE — Progress Notes (Signed)
Subjective:     Patient ID: Derek Vincent, male   DOB: Aug 03, 1923, 77 y.o.   MRN: [id number]  HPI this 77 year old male patient had a left brachial-cephalic AV fistula created by me on 12/21/2010. He denies any pain in the left hand. He does occasionally have some tingling in the hand. He states the hand is quite functional. He is on hemodialysis through a right IJ catheter.   Review of Systems     Objective:   Physical Exam blood pressure today is 107/44 heart rate 60 respirations 20 Upper extremity has an excellent pulse and thrill in the left upper arm fistula. There is a 1+ to 2+ radial pulse palpable which improved to 3+ with compression of the fistula. The left hand is adequately perfused with good strength and sensation.    Assessment:     Left AV fistula nicely functioning-mild steal symptoms    Plan:     Okay to use left upper extremity AV fistula in mid December Patient develops pain or increasing numbness in left hand please refer to rest to evaluate for worsening steal syndrome. Symptoms are quite mild at the present time

## 2011-04-18 ENCOUNTER — Other Ambulatory Visit (HOSPITAL_COMMUNITY): Payer: Self-pay | Admitting: Nephrology

## 2011-04-18 DIAGNOSIS — N186 End stage renal disease: Secondary | ICD-10-CM

## 2011-04-23 ENCOUNTER — Ambulatory Visit (HOSPITAL_COMMUNITY)
Admission: RE | Admit: 2011-04-23 | Discharge: 2011-04-23 | Disposition: A | Discharge status: Home / Self Care | Payer: Medicare Other | Source: Ambulatory Visit | Attending: Nephrology | Admitting: Nephrology

## 2011-04-23 DIAGNOSIS — N186 End stage renal disease: Secondary | ICD-10-CM

## 2011-04-23 DIAGNOSIS — Z452 Encounter for adjustment and management of vascular access device: Secondary | ICD-10-CM | POA: Insufficient documentation

## 2011-04-23 MED ORDER — CHLORHEXIDINE GLUCONATE 4 % EX LIQD
CUTANEOUS | Status: AC
  Filled 2011-04-23: qty 30

## 2011-04-23 NOTE — Procedures (Signed)
Removal of right chest dialysis catheter without complication.   

## 2011-05-31 ENCOUNTER — Other Ambulatory Visit (HOSPITAL_COMMUNITY): Payer: Self-pay | Admitting: Nephrology

## 2011-05-31 DIAGNOSIS — N186 End stage renal disease: Secondary | ICD-10-CM

## 2011-06-04 ENCOUNTER — Ambulatory Visit (HOSPITAL_COMMUNITY)
Admission: RE | Admit: 2011-06-04 | Discharge: 2011-06-04 | Disposition: A | Discharge status: Home / Self Care | Payer: Medicare Other | Source: Ambulatory Visit | Attending: Nephrology | Admitting: Nephrology

## 2011-06-04 DIAGNOSIS — N186 End stage renal disease: Secondary | ICD-10-CM

## 2011-06-04 DIAGNOSIS — T82898A Other specified complication of vascular prosthetic devices, implants and grafts, initial encounter: Secondary | ICD-10-CM | POA: Insufficient documentation

## 2011-06-04 DIAGNOSIS — Y849 Medical procedure, unspecified as the cause of abnormal reaction of the patient, or of later complication, without mention of misadventure at the time of the procedure: Secondary | ICD-10-CM | POA: Insufficient documentation

## 2011-06-04 MED ORDER — IOHEXOL 300 MG/ML  SOLN
100.0000 mL | Freq: Once | INTRAMUSCULAR | Status: AC | PRN
  Administered 2011-06-04: 24 mL via INTRAVENOUS

## 2011-06-05 ENCOUNTER — Telehealth (HOSPITAL_COMMUNITY): Payer: Self-pay

## 2012-05-08 ENCOUNTER — Encounter (HOSPITAL_COMMUNITY): Payer: Self-pay | Admitting: Pharmacist

## 2012-05-08 ENCOUNTER — Other Ambulatory Visit: Payer: Self-pay

## 2012-05-14 ENCOUNTER — Encounter (HOSPITAL_COMMUNITY)
Admission: RE | Disposition: A | Discharge status: Home / Self Care | Payer: Self-pay | Source: Ambulatory Visit | Attending: Vascular Surgery

## 2012-05-14 ENCOUNTER — Ambulatory Visit (HOSPITAL_COMMUNITY)
Admission: RE | Admit: 2012-05-14 | Discharge: 2012-05-14 | Disposition: A | Discharge status: Home / Self Care | Payer: Medicare Other | Source: Ambulatory Visit | Attending: Vascular Surgery | Admitting: Vascular Surgery

## 2012-05-14 DIAGNOSIS — I2510 Atherosclerotic heart disease of native coronary artery without angina pectoris: Secondary | ICD-10-CM | POA: Insufficient documentation

## 2012-05-14 DIAGNOSIS — T82898A Other specified complication of vascular prosthetic devices, implants and grafts, initial encounter: Secondary | ICD-10-CM | POA: Insufficient documentation

## 2012-05-14 DIAGNOSIS — Y832 Surgical operation with anastomosis, bypass or graft as the cause of abnormal reaction of the patient, or of later complication, without mention of misadventure at the time of the procedure: Secondary | ICD-10-CM | POA: Insufficient documentation

## 2012-05-14 DIAGNOSIS — I120 Hypertensive chronic kidney disease with stage 5 chronic kidney disease or end stage renal disease: Secondary | ICD-10-CM | POA: Insufficient documentation

## 2012-05-14 DIAGNOSIS — N186 End stage renal disease: Secondary | ICD-10-CM | POA: Insufficient documentation

## 2012-05-14 HISTORY — PX: SHUNTOGRAM: SHX5491

## 2012-05-14 LAB — POCT I-STAT, CHEM 8
BUN: 52 mg/dL — ABNORMAL HIGH (ref 6–23)
Calcium, Ion: 1.03 mmol/L — ABNORMAL LOW (ref 1.13–1.30)
Creatinine, Ser: 4.8 mg/dL — ABNORMAL HIGH (ref 0.50–1.35)
TCO2: 29 mmol/L (ref 0–100)

## 2012-05-14 SURGERY — ASSESSMENT, SHUNT FUNCTION, WITH CONTRAST RADIOGRAPHIC STUDY
Anesthesia: LOCAL | Laterality: Left

## 2012-05-14 MED ORDER — SODIUM CHLORIDE 0.9 % IJ SOLN: 3.0000 mL | INTRAMUSCULAR | Status: DC | PRN

## 2012-05-14 MED ORDER — LIDOCAINE HCL (PF) 1 % IJ SOLN
INTRAMUSCULAR | Status: AC
  Filled 2012-05-14: qty 30

## 2012-05-14 MED ORDER — ACETAMINOPHEN 325 MG PO TABS: 650.0000 mg | ORAL_TABLET | ORAL | Status: DC | PRN

## 2012-05-14 MED ORDER — ONDANSETRON HCL 4 MG/2ML IJ SOLN: 4.0000 mg | Freq: Four times a day (QID) | INTRAMUSCULAR | Status: DC | PRN

## 2012-05-14 MED ORDER — SODIUM CHLORIDE 0.9 % IJ SOLN: 3.0000 mL | Freq: Two times a day (BID) | INTRAMUSCULAR | Status: DC

## 2012-05-14 MED ORDER — HEPARIN (PORCINE) IN NACL 2-0.9 UNIT/ML-% IJ SOLN
INTRAMUSCULAR | Status: AC
  Filled 2012-05-14: qty 500

## 2012-05-14 MED ORDER — SODIUM CHLORIDE 0.9 % IV SOLN: 250.0000 mL | INTRAVENOUS | Status: DC | PRN

## 2012-05-14 NOTE — H&P (Signed)
[facility] [place]  Brief History and Physical  History of Present Illness  Derek Vincent is a 79 y.o. male who presents with chief complaint: malfunctioning left upper arm access.  The patient presents today for Left arm fistulogram, possible intervention.    Past Medical History  Diagnosis Date  . CAD (coronary artery disease)     presumed. LV function normal in 07, did decline to 30-40% in 08 when presented w/cCHF. paroxysmal a-fib w/repaid ventricular response on 2007 and during his current admission (as maintaining currently normal sinus rhythm pt refused Coumadin in the past)  . Chronic renal insufficiency     creatinine varying 2.4-2.9  . Dyslipidemia   . HTN (hypertension)   . Prostate cancer   . DJD (degenerative joint disease)     Past Surgical History  Procedure Date  . Appendectomy   . Hernia repair   . Back surgery   . Dialysis catheter placement 11/09/10    Right neck   ( Done in interventional Radiology)    History   Social History  . Marital Status: Married    Spouse Name: N/A    Number of Children: N/A  . Years of Education: N/A   Occupational History  . Not on file.   Social History Main Topics  . Smoking status: Current Every Day Smoker -- 0.5 packs/day for 75 years    Types: Cigarettes  . Smokeless tobacco: Never Used     Comment: 71 pack -year history   . Alcohol Use: No     Comment: noalcohol in last 20 years   . Drug Use: No  . Sexually Active: Not on file   Other Topics Concern  . Not on file   Social History Narrative   Pt is married and has 2 kids. Is retired and [place].     Family History  Problem Relation Age of Onset  . Coronary artery disease      family hx  . Heart disease Mother   . Cancer Father     No current facility-administered medications on file prior to encounter.   Current Outpatient Prescriptions on File Prior to Encounter  Medication Sig Dispense Refill  .  aspirin 325 MG tablet Take 325 mg by mouth daily.       . clopidogrel (PLAVIX) 75 MG tablet Take 75 mg by mouth daily.       Marland Kitchen lisinopril (PRINIVIL,ZESTRIL) 10 MG tablet Take 10 mg by mouth See admin instructions. Takes 10mg  daily at 8 AM on Sundays, Tuesdays, Thursdays, and Saturdays.  Takes 10mg  daily at [facility] on mondays, Wednesdays, and Fridays.      . pantoprazole (PROTONIX) 40 MG tablet Take 40 mg by mouth daily.          No Known Allergies  Review of Systems: As listed above, otherwise negative.  Physical Examination  Filed Vitals:   05/14/12 0926  Height: 5\' 11"  (1.803 m)  Weight: 217 lb (98.431 kg)    General: A&O x 3, WDWN  Pulmonary: Sym exp, good air movt, CTAB, no rales, rhonchi, & wheezing  Cardiac: RRR, Nl S1, S2, no Murmurs, rubs or gallops  Gastrointestinal: soft, NTND, -G/R, - HSM, - masses, - CVAT B  Musculoskeletal: M/S 5/5 throughout , Extremities without ischemic changes , weak thrill and bruit  Laboratory See iStat  Medical Decision Making  Derek Vincent is a 79 y.o. male who presents with: malfunctioning left arm access.  The patient is scheduled for: L arm fistulogram, possible intervention I discussed with the patient the nature of angiographic procedures, especially the limited patencies of any endovascular intervention.  The patient is aware of that the risks of an angiographic procedure include but are not limited to: bleeding, infection, access site complications, renal failure, embolization, rupture of vessel, dissection, possible need for emergent surgical intervention, possible need for surgical procedures to treat the patient's pathology, and stroke and death.    The patient is aware of the risks and agrees to proceed.  Leonides Sake, MD [facility] [place] Office: [phone] Pager: [phone]  05/14/2012, 9:27 AM

## 2012-05-14 NOTE — Op Note (Addendum)
OPERATIVE NOTE   PROCEDURE: 1. left brachiocephalic arteriovenous fistula cannulation 2. left arm fistulogram  PRE-OPERATIVE DIAGNOSIS: Malfunctioning left arm arteriovenous fistula  POST-OPERATIVE DIAGNOSIS: same as above   SURGEON: Leonides Sake, MD  ANESTHESIA: local  ESTIMATED BLOOD LOSS: 5 cc  FINDING(S): 1. Widely patent central venous structures 2. Widely patent brachiocephalic fistula with aneurysm distal segment 3. Widely patent anastomosis  SPECIMEN(S):  None  CONTRAST: 30 cc  INDICATIONS: Derek Vincent is a 79 y.o. male who  presents with malfunctioning left brachiocephalic arteriovenous fistula.  The patient is scheduled for left arm fistulogram.  The patient is aware the risks include but are not limited to: bleeding, infection, thrombosis of the cannulated access, and possible anaphylactic reaction to the contrast.  The patient is aware of the risks of the procedure and elects to proceed forward.  DESCRIPTION: After full informed written consent was obtained, the patient was brought back to the angiography suite and placed supine upon the angiography table.  The patient was connected to monitoring equipment.  The left arm was prepped and draped in the standard fashion for a left arm fistulogram.  Under ultrasound guidance, the left brachiocephalic arteriovenous fistula was cannulated with a micropuncture needle.  The microwire was advanced into the fistula and the needle was exchanged for the a microsheath, which was lodged 2 cm into the access.  The wire was removed and the sheath was connected to the IV extension tubing.  Hand injections were completed to image the access from the antecubitum up to the level of axilla.  The central venous structures were also imaged by hand injections.  Based on the images, this patient will need: no intervention.  A 4-0 Monocryl purse-string suture was sewn around the sheath.  The sheath was removed while tying down the suture.  A  sterile bandage was applied to the puncture site.  COMPLICATIONS: none  CONDITION: stable  Leonides Sake, MD [facility] [place] Office: [phone] Pager: [phone]  05/14/2012 10:12 AM

## 2014-02-13 ENCOUNTER — Encounter (HOSPITAL_COMMUNITY): Payer: Self-pay | Admitting: Internal Medicine

## 2014-02-13 ENCOUNTER — Inpatient Hospital Stay (HOSPITAL_COMMUNITY): Payer: Medicare Other

## 2014-02-13 ENCOUNTER — Inpatient Hospital Stay (HOSPITAL_COMMUNITY)
Admission: EM | Admit: 2014-02-13 | Discharge: 2014-02-15 | DRG: 377 | Disposition: A | Discharge status: Home / Self Care | Payer: Medicare Other | Source: Other Acute Inpatient Hospital | Attending: Internal Medicine | Admitting: Internal Medicine

## 2014-02-13 DIAGNOSIS — I2510 Atherosclerotic heart disease of native coronary artery without angina pectoris: Secondary | ICD-10-CM | POA: Diagnosis present

## 2014-02-13 DIAGNOSIS — N186 End stage renal disease: Secondary | ICD-10-CM | POA: Diagnosis not present

## 2014-02-13 DIAGNOSIS — E861 Hypovolemia: Secondary | ICD-10-CM | POA: Diagnosis not present

## 2014-02-13 DIAGNOSIS — Z992 Dependence on renal dialysis: Secondary | ICD-10-CM

## 2014-02-13 DIAGNOSIS — D62 Acute posthemorrhagic anemia: Secondary | ICD-10-CM | POA: Diagnosis not present

## 2014-02-13 DIAGNOSIS — J449 Chronic obstructive pulmonary disease, unspecified: Secondary | ICD-10-CM | POA: Diagnosis not present

## 2014-02-13 DIAGNOSIS — T45525A Adverse effect of antithrombotic drugs, initial encounter: Secondary | ICD-10-CM | POA: Diagnosis present

## 2014-02-13 DIAGNOSIS — E669 Obesity, unspecified: Secondary | ICD-10-CM | POA: Diagnosis present

## 2014-02-13 DIAGNOSIS — I5022 Chronic systolic (congestive) heart failure: Secondary | ICD-10-CM | POA: Diagnosis present

## 2014-02-13 DIAGNOSIS — E785 Hyperlipidemia, unspecified: Secondary | ICD-10-CM | POA: Diagnosis present

## 2014-02-13 DIAGNOSIS — Z8546 Personal history of malignant neoplasm of prostate: Secondary | ICD-10-CM | POA: Diagnosis not present

## 2014-02-13 DIAGNOSIS — E7800 Pure hypercholesterolemia, unspecified: Secondary | ICD-10-CM

## 2014-02-13 DIAGNOSIS — K224 Dyskinesia of esophagus: Secondary | ICD-10-CM | POA: Diagnosis not present

## 2014-02-13 DIAGNOSIS — I120 Hypertensive chronic kidney disease with stage 5 chronic kidney disease or end stage renal disease: Secondary | ICD-10-CM | POA: Diagnosis present

## 2014-02-13 DIAGNOSIS — I2582 Chronic total occlusion of coronary artery: Secondary | ICD-10-CM | POA: Diagnosis present

## 2014-02-13 DIAGNOSIS — I480 Paroxysmal atrial fibrillation: Secondary | ICD-10-CM | POA: Diagnosis present

## 2014-02-13 DIAGNOSIS — I252 Old myocardial infarction: Secondary | ICD-10-CM

## 2014-02-13 DIAGNOSIS — I959 Hypotension, unspecified: Secondary | ICD-10-CM | POA: Diagnosis present

## 2014-02-13 DIAGNOSIS — K921 Melena: Secondary | ICD-10-CM | POA: Insufficient documentation

## 2014-02-13 DIAGNOSIS — R54 Age-related physical debility: Secondary | ICD-10-CM | POA: Diagnosis present

## 2014-02-13 DIAGNOSIS — I77819 Aortic ectasia, unspecified site: Secondary | ICD-10-CM | POA: Diagnosis present

## 2014-02-13 DIAGNOSIS — K4020 Bilateral inguinal hernia, without obstruction or gangrene, not specified as recurrent: Secondary | ICD-10-CM | POA: Diagnosis not present

## 2014-02-13 DIAGNOSIS — Z7902 Long term (current) use of antithrombotics/antiplatelets: Secondary | ICD-10-CM | POA: Insufficient documentation

## 2014-02-13 DIAGNOSIS — D631 Anemia in chronic kidney disease: Secondary | ICD-10-CM | POA: Diagnosis present

## 2014-02-13 DIAGNOSIS — Z720 Tobacco use: Secondary | ICD-10-CM | POA: Insufficient documentation

## 2014-02-13 DIAGNOSIS — F17210 Nicotine dependence, cigarettes, uncomplicated: Secondary | ICD-10-CM | POA: Diagnosis not present

## 2014-02-13 DIAGNOSIS — K5731 Diverticulosis of large intestine without perforation or abscess with bleeding: Principal | ICD-10-CM | POA: Diagnosis present

## 2014-02-13 DIAGNOSIS — I9589 Other hypotension: Secondary | ICD-10-CM

## 2014-02-13 DIAGNOSIS — N2581 Secondary hyperparathyroidism of renal origin: Secondary | ICD-10-CM | POA: Diagnosis present

## 2014-02-13 DIAGNOSIS — K219 Gastro-esophageal reflux disease without esophagitis: Secondary | ICD-10-CM | POA: Diagnosis present

## 2014-02-13 DIAGNOSIS — Z8673 Personal history of transient ischemic attack (TIA), and cerebral infarction without residual deficits: Secondary | ICD-10-CM

## 2014-02-13 DIAGNOSIS — I10 Essential (primary) hypertension: Secondary | ICD-10-CM | POA: Diagnosis not present

## 2014-02-13 DIAGNOSIS — R062 Wheezing: Secondary | ICD-10-CM

## 2014-02-13 DIAGNOSIS — K922 Gastrointestinal hemorrhage, unspecified: Secondary | ICD-10-CM | POA: Diagnosis present

## 2014-02-13 DIAGNOSIS — Z8249 Family history of ischemic heart disease and other diseases of the circulatory system: Secondary | ICD-10-CM | POA: Diagnosis not present

## 2014-02-13 DIAGNOSIS — I4891 Unspecified atrial fibrillation: Secondary | ICD-10-CM | POA: Diagnosis present

## 2014-02-13 HISTORY — DX: Cardiac arrhythmia, unspecified: I49.9

## 2014-02-13 HISTORY — DX: Dependence on renal dialysis: Z99.2

## 2014-02-13 HISTORY — DX: Unspecified malignant neoplasm of skin, unspecified: C44.90

## 2014-02-13 HISTORY — DX: End stage renal disease: N18.6

## 2014-02-13 HISTORY — DX: Unspecified atrial fibrillation: I48.91

## 2014-02-13 HISTORY — DX: Chronic obstructive pulmonary disease, unspecified: J44.9

## 2014-02-13 HISTORY — DX: Cerebral infarction, unspecified: I63.9

## 2014-02-13 HISTORY — DX: Gastro-esophageal reflux disease without esophagitis: K21.9

## 2014-02-13 LAB — CBC
HCT: 20.6 % — ABNORMAL LOW (ref 39.0–52.0)
Hemoglobin: 6.4 g/dL — CL (ref 13.0–17.0)
MCH: 31.7 pg (ref 26.0–34.0)
MCHC: 31.1 g/dL (ref 30.0–36.0)
MCV: 102 fL — AB (ref 78.0–100.0)
Platelets: 250 10*3/uL (ref 150–400)
RBC: 2.02 MIL/uL — ABNORMAL LOW (ref 4.22–5.81)
RDW: 20.2 % — AB (ref 11.5–15.5)
WBC: 11.9 10*3/uL — ABNORMAL HIGH (ref 4.0–10.5)

## 2014-02-13 LAB — BASIC METABOLIC PANEL
Anion gap: 18 — ABNORMAL HIGH (ref 5–15)
BUN: 97 mg/dL — AB (ref 6–23)
CHLORIDE: 102 meq/L (ref 96–112)
CO2: 18 mEq/L — ABNORMAL LOW (ref 19–32)
CREATININE: 6.17 mg/dL — AB (ref 0.50–1.35)
Calcium: 7.3 mg/dL — ABNORMAL LOW (ref 8.4–10.5)
GFR, EST AFRICAN AMERICAN: 8 mL/min — AB (ref >90)
GFR, EST NON AFRICAN AMERICAN: 7 mL/min — AB (ref >90)
GLUCOSE: 101 mg/dL — AB (ref 70–99)
Potassium: 5.1 mEq/L (ref 3.7–5.3)
Sodium: 138 mEq/L (ref 137–147)

## 2014-02-13 LAB — MRSA PCR SCREENING: MRSA by PCR: POSITIVE — AB

## 2014-02-13 LAB — HEMOGLOBIN AND HEMATOCRIT, BLOOD
HCT: 20.6 % — ABNORMAL LOW (ref 39.0–52.0)
HCT: 24.8 % — ABNORMAL LOW (ref 39.0–52.0)
HEMOGLOBIN: 8.2 g/dL — AB (ref 13.0–17.0)
Hemoglobin: 6.6 g/dL — CL (ref 13.0–17.0)

## 2014-02-13 LAB — ABO/RH: ABO/RH(D): O NEG

## 2014-02-13 LAB — PREPARE RBC (CROSSMATCH)

## 2014-02-13 MED ORDER — ACETAMINOPHEN 650 MG RE SUPP: 650.0000 mg | Freq: Four times a day (QID) | RECTAL | Status: DC | PRN

## 2014-02-13 MED ORDER — ALBUMIN HUMAN 25 % IV SOLN
INTRAVENOUS | Status: AC
  Administered 2014-02-13: 25 g
  Filled 2014-02-13: qty 200

## 2014-02-13 MED ORDER — SODIUM CHLORIDE 0.9 % IJ SOLN: 3.0000 mL | INTRAMUSCULAR | Status: DC | PRN

## 2014-02-13 MED ORDER — CETYLPYRIDINIUM CHLORIDE 0.05 % MT LIQD
7.0000 mL | Freq: Two times a day (BID) | OROMUCOSAL | Status: DC
  Administered 2014-02-13 – 2014-02-15 (×5): 7 mL via OROMUCOSAL

## 2014-02-13 MED ORDER — SODIUM CHLORIDE 0.9 % IV SOLN: Freq: Once | INTRAVENOUS | Status: AC

## 2014-02-13 MED ORDER — SODIUM CHLORIDE 0.9 % IV SOLN: Freq: Once | INTRAVENOUS | Status: DC

## 2014-02-13 MED ORDER — ONDANSETRON HCL 4 MG/2ML IJ SOLN: 4.0000 mg | Freq: Four times a day (QID) | INTRAMUSCULAR | Status: DC | PRN

## 2014-02-13 MED ORDER — SODIUM CHLORIDE 0.9 % IJ SOLN
3.0000 mL | Freq: Two times a day (BID) | INTRAMUSCULAR | Status: DC
  Administered 2014-02-13 – 2014-02-15 (×4): 3 mL via INTRAVENOUS

## 2014-02-13 MED ORDER — SODIUM CHLORIDE 0.9 % IV SOLN
Freq: Once | INTRAVENOUS | Status: AC
  Administered 2014-02-13: 10 mL via INTRAVENOUS

## 2014-02-13 MED ORDER — SODIUM CHLORIDE 0.9 % IV BOLUS (SEPSIS)
250.0000 mL | Freq: Once | INTRAVENOUS | Status: AC
  Administered 2014-02-13: 250 mL via INTRAVENOUS

## 2014-02-13 MED ORDER — IOHEXOL 300 MG/ML  SOLN
25.0000 mL | INTRAMUSCULAR | Status: AC
  Administered 2014-02-13 (×2): 25 mL via ORAL

## 2014-02-13 MED ORDER — HYDROMORPHONE HCL 1 MG/ML IJ SOLN
0.5000 mg | INTRAMUSCULAR | Status: DC | PRN
  Administered 2014-02-13 (×2): 1 mg via INTRAVENOUS
  Filled 2014-02-13 (×2): qty 1

## 2014-02-13 MED ORDER — ACETAMINOPHEN 325 MG PO TABS: 650.0000 mg | ORAL_TABLET | Freq: Four times a day (QID) | ORAL | Status: DC | PRN

## 2014-02-13 MED ORDER — DIGOXIN 0.25 MG/ML IJ SOLN
0.2500 mg | Freq: Once | INTRAMUSCULAR | Status: AC
  Administered 2014-02-14: 0.25 mg via INTRAVENOUS
  Filled 2014-02-13: qty 1

## 2014-02-13 MED ORDER — PANTOPRAZOLE SODIUM 40 MG IV SOLR
40.0000 mg | Freq: Two times a day (BID) | INTRAVENOUS | Status: DC
  Administered 2014-02-13 – 2014-02-15 (×5): 40 mg via INTRAVENOUS
  Filled 2014-02-13 (×7): qty 40

## 2014-02-13 MED ORDER — SODIUM CHLORIDE 0.9 % IV SOLN: 250.0000 mL | INTRAVENOUS | Status: DC | PRN

## 2014-02-13 MED ORDER — DIGOXIN 0.25 MG/ML IJ SOLN: 0.1250 mg | Freq: Every day | INTRAMUSCULAR | Status: DC

## 2014-02-13 MED ORDER — ONDANSETRON HCL 4 MG PO TABS: 4.0000 mg | ORAL_TABLET | Freq: Four times a day (QID) | ORAL | Status: DC | PRN

## 2014-02-13 MED ORDER — DIGOXIN 0.25 MG/ML IJ SOLN
0.2500 mg | Freq: Once | INTRAMUSCULAR | Status: AC
  Administered 2014-02-13: 0.25 mg via INTRAVENOUS
  Filled 2014-02-13: qty 1

## 2014-02-13 MED ORDER — OXYCODONE HCL 5 MG PO TABS: 5.0000 mg | ORAL_TABLET | ORAL | Status: DC | PRN

## 2014-02-13 MED ORDER — ALBUMIN HUMAN 25 % IV SOLN
50.0000 g | Freq: Once | INTRAVENOUS | Status: AC
  Administered 2014-02-13: 25 g via INTRAVENOUS
  Filled 2014-02-13: qty 200

## 2014-02-13 NOTE — Progress Notes (Signed)
Pt educated on s/sxs of blood tx reaction including, itching, flushing, back pain, SOB and need to inform nurses of any reaction signs. Pt reports understanding. Family at bedside. No questions.

## 2014-02-13 NOTE — Consult Note (Signed)
Referring Provider: [facility] Primary Care Physician:  Manon Hilding, MD Primary Gastroenterologist:  unassigned  Reason for Consultation:  Gi bleed     HPI: Derek Vincent is a 80 y.o. male admitted via ER due to GI bleed with passage of bright red bloody stools.Pt has a history of CAD, Chronic Systolic CHF, ESRD on HD (on MWF), HTN, Atrial Fibrillation, and hyperlipidemia who was seen at the [facility] ED after he had been passing dark red bloody stoolssince yesterday afternoon He reports having weakness and dizziness. He denies having any hematemesis or nausea or vomiting. He also denies having ABD Pain. His initial Hemoglobin was 8.4 in the ED which was down from 10.5 six months ago. He was also found to have Atrial fibrillation with RVR And was given a bolus of IVFs, and 10 mg IV Cardizem, and then received 1 unit of RBCs prior to transport. Gi is not available at [facility] on the weekends so arrangements were made to transfer the patient to [facility] for admission.Pt has been on plavix which is currently on hold.Pt receiving another  unit of blood now.He has had 3 dark maroon BMs this morning between 4 and 6 am. Pt says he does not remember ever having a colonoscopy in the past, nor does he remember if he has ever seen GI in [employer].   Past Medical History  Diagnosis Date  . CAD (coronary artery disease)     presumed. LV function normal in 07, did decline to 30-40% in 08 when presented w/cCHF. paroxysmal a-fib w/repaid ventricular response on 2007 and during his current admission (as maintaining currently normal sinus rhythm pt refused Coumadin in the past)  . Chronic renal insufficiency     creatinine varying 2.4-2.9  . Dyslipidemia   . HTN (hypertension)   . DJD (degenerative joint disease)   . Atrial fibrillation   . Dysrhythmia   . Stroke   . COPD (chronic obstructive pulmonary disease)   . ESRD (end stage renal disease) on dialysis     MWF  .  Prostate cancer   . Skin cancer   . GERD (gastroesophageal reflux disease)     Past Surgical History  Procedure Laterality Date  . Appendectomy    . Hernia repair    . Back surgery    . Dialysis catheter placement  11/09/10    Right neck   ( Done in interventional Radiology)  . Av fistula placement      Prior to Admission medications   Medication Sig Start Date End Date Taking? Authorizing Provider  acetaminophen (TYLENOL) 325 MG tablet Take 650 mg by mouth 2 (two) times daily as needed. For pain    Historical Provider, MD  aspirin 325 MG tablet Take 325 mg by mouth daily.     Historical Provider, MD  atorvastatin (LIPITOR) 10 MG tablet Take 10 mg by mouth at bedtime.    Historical Provider, MD  clopidogrel (PLAVIX) 75 MG tablet Take 75 mg by mouth daily.     Historical Provider, MD  folic acid-vitamin b complex-vitamin c-selenium-zinc (DIALYVITE) 3 MG TABS Take 1 tablet by mouth daily.    Historical Provider, MD  isosorbide mononitrate (IMDUR) 30 MG 24 hr tablet Take 90 mg by mouth daily.    Historical Provider, MD  lidocaine-prilocaine (EMLA) cream Apply 1 application topically every Monday, Wednesday, and Friday with hemodialysis. Apply to left upper arm fistula 1 hour prior to dialysis    Historical Provider, MD  lisinopril (PRINIVIL,ZESTRIL)  10 MG tablet Take 10 mg by mouth See admin instructions. Takes 10mg  daily at 8 AM on Sundays, Tuesdays, Thursdays, and Saturdays.  Takes 10mg  daily at [facility] on mondays, Wednesdays, and Fridays.    Historical Provider, MD  metoprolol succinate (TOPROL-XL) 100 MG 24 hr tablet Take 100 mg by mouth See admin instructions. Takes 100mg  twice daily at 8 AM and 8PM on Sundays, Tuesdays, Thursdays, and Saturdays.  Takes 100mg  twice daily at 4 PM and 8 PM on Mondays, Wednesdays, and Fridays.    Historical Provider, MD  pantoprazole (PROTONIX) 40 MG tablet Take 40 mg by mouth daily.      Historical Provider, MD    Current Facility-Administered Medications    Medication Dose Route Frequency Provider Last Rate Last Dose  . 0.9 %  sodium chloride infusion  250 mL Intravenous PRN Theressa Millard, MD      . 0.9 %  sodium chloride infusion   Intravenous Once Theressa Millard, MD      . acetaminophen (TYLENOL) tablet 650 mg  650 mg Oral Q6H PRN Theressa Millard, MD       Or  . acetaminophen (TYLENOL) suppository 650 mg  650 mg Rectal Q6H PRN Theressa Millard, MD      . antiseptic oral rinse (CPC / CETYLPYRIDINIUM CHLORIDE 0.05%) solution 7 mL  7 mL Mouth Rinse BID Robbie Lis, MD      . HYDROmorphone (DILAUDID) injection 0.5-1 mg  0.5-1 mg Intravenous Q3H PRN Theressa Millard, MD   1 mg at 02/13/14 1610  . ondansetron (ZOFRAN) tablet 4 mg  4 mg Oral Q6H PRN Theressa Millard, MD       Or  . ondansetron (ZOFRAN) injection 4 mg  4 mg Intravenous Q6H PRN Harvette Evonnie Dawes, MD      . oxyCODONE (Oxy IR/ROXICODONE) immediate release tablet 5 mg  5 mg Oral Q4H PRN Theressa Millard, MD      . pantoprazole (PROTONIX) injection 40 mg  40 mg Intravenous Q12H Robbie Lis, MD      . sodium chloride 0.9 % injection 3 mL  3 mL Intravenous Q12H Harvette Evonnie Dawes, MD      . sodium chloride 0.9 % injection 3 mL  3 mL Intravenous PRN Theressa Millard, MD        Allergies as of 02/13/2014  . (No Known Allergies)    Family History  Problem Relation Age of Onset  . Coronary artery disease      family hx  . Heart disease Mother   . Cancer Father     History   Social History  . Marital Status: Married    Spouse Name: N/A    Number of Children: N/A  . Years of Education: N/A   Occupational History  . Not on file.   Social History Main Topics  . Smoking status: Current Every Day Smoker -- 0.50 packs/day for 75 years    Types: Cigarettes  . Smokeless tobacco: Never Used     Comment: 71 pack -year history   . Alcohol Use: No     Comment: noalcohol in last 20 years   . Drug Use: No  . Sexual Activity: Not on file   Other Topics  Concern  . Not on file   Social History Narrative   Pt is married and has 2 kids. Is [place].     Review of Systems: Gen: Denies  any fever, chills CV: Denies chest pain Resp: Denies dyspnea at rest, GI: Admits to red blood per rectum since yesterday GU : Denies urinary burning, blood in urine, urinary frequency, urinary hesitancy, nocturnal urination, and urinary incontinence. MS: Denies joint pain Derm: Denies rash, itching, dry skin, hives, moles, warts, or unhealing ulcers.  Psych: Denies depression, anxiety Heme: Denies bruising, bleeding, and enlarged lymph nodes. Neuro:  Denies any headaches, dizziness, paresthesias.  Physical Exam: Vital signs in last 24 hours: Temp:  [97.8 F (36.6 C)-98.3 F (36.8 C)] 97.8 F (36.6 C) (11/08 0805) Pulse Rate:  [108-135] 119 (11/08 0805) Resp:  [14-29] 14 (11/08 0805) BP: (86-119)/(34-60) 86/40 mmHg (11/08 0805) SpO2:  [97 %-100 %] 97 % (11/08 0805) Weight:  [233 lb 14.5 oz (106.1 kg)] 233 lb 14.5 oz (106.1 kg) (11/08 0330) Last BM Date: 02/13/14 General:   Alert,  Well-developed, well-nourished, pleasant and cooperative  Head:  Normocephalic and atraumatic. Eyes:  Sclera clear, no icterus.   Conjunctiva pink. Ears:  Normal auditory acuity. Nose:  No deformity, discharge,  or lesions. Mouth:  No deformity or lesions.   Neck:  Supple; no masses or thyromegaly. Lungs:  Clear to ausc  Heart:  irreg irreg Abdomen:  Soft,nontender, BS active,nonpalp mass or hsm.   Rectal:  Deferred --pt had 3 dark maroon BMs thsi am per nurse Msk:  Symmetrical without gross deformities. . Pulses:  Normal pulses noted. Extremities:  Without clubbing or edema. Neurologic:  Alert and  oriented x4;  grossly normal neurologically. Skin:  Intact without significant lesions or rashes.. Psych:  Alert and cooperative. Normal mood and affect.  Intake/Output from previous day: 11/07 0701 - 11/08 0700 In: 250 [IV  Piggyback:250] Out: -  Intake/Output this shift:    Lab Results:  Recent Labs  02/13/14 0558  WBC 11.9*  HGB 6.4*  HCT 20.6*  PLT 250   BMET  Recent Labs  02/13/14 0558  NA 138  K 5.1  CL 102  CO2 18*  GLUCOSE 101*  BUN 97*  CREATININE 6.17*  CALCIUM 7.3*     IMPRESSION:/PLAN: 1.GI bleed-painless. Rec 1 unit prbcs at [facility]. Hgb 6.2, receiving another unit. Pt remains hypotensive and tachycardic. Will need continued monitoring. Follow h/h. Possible diverticular bleed . Will review with Dr Hilarie Fredrickson re possible intraluminal eval, but pt would need to be further stabilized first.Will order additional unit of blood, hope to  Get Hgb 8 or higher. Consider bleeding scan. 2. Acute blood loss anemia--due to #1. 3. Hypotension. Due to volume loss. Continue IVF. Monitor I/Os. 4.A fib with RVR--hopefully will improve with volume. 5.ESRD-on dialysis. Med service has notified dialysis team.   Dru Primeau, Deloris Ping 02/13/2014,  Pager [phone]

## 2014-02-13 NOTE — Progress Notes (Signed)
Pt HR now sustained 120's-140's. PA paged to notify.

## 2014-02-13 NOTE — Plan of Care (Signed)
Problem: Phase I Progression Outcomes Goal: Pain controlled with appropriate interventions Outcome: Completed/Met Date Met:  02/13/14 Goal: Hemodynamically stable Outcome: Completed/Met Date Met:  02/13/14

## 2014-02-13 NOTE — H&P (Signed)
[facility] Admission History and Physical       Derek Vincent HYW:[id number] DOB: Dec 01, 1923 DOA: 02/13/2014  Referring physician: EDP PCP: Manon Hilding, MD  Specialists:   Chief Complaint: Dark Red Bloody Stools  HPI: Derek Vincent is a 80 y.o. male with a history of CAD, Chronic Systolic CHF, ESRD on HD  (on MWF), HTN, Atrial Fibrillation, and hyperlipidemia who was seen at the [facility] ED after he had been passing dark red bloody stools  Since the afternoon.    He reports having weakness and dizziness.  He denies having any hematemesis or nausea or vomiting.   He also denies having ABD Pain.   His initial Hemoglobin was 8.4 in the ED which was down from 10.5 six months ago.   He was also found to have Atrial fibrillation with RVR  And was given a bolus of IVFs, and 10 mg IV Cardizem, and then received 1 unit of RBCs prior to transport.  Gi is not available at [facility] on the weekends so arrangements were made to transfer the patient to [facility] for [facility].     Review of Systems:  Constitutional: No Weight Loss, No Weight Gain, Night Sweats, Fevers, Chills, Dizziness, Fatigue, or Generalized Weakness HEENT: No Headaches, Difficulty Swallowing,Tooth/Dental Problems,Sore Throat,  No Sneezing, Rhinitis, Ear Ache, Nasal Congestion, or Post Nasal Drip,  Cardio-vascular:  No Chest pain, Orthopnea, PND, Edema in Lower Extremities, Anasarca, Dizziness, Palpitations  Resp: No Dyspnea, No DOE, No Productive Cough, No Non-Productive Cough, No Hemoptysis, No Wheezing.    GI: No Heartburn, Indigestion, Abdominal Pain, Nausea, Vomiting, Diarrhea, Hematemesis, +Hematochezia, Melena, Change in Bowel Habits,  Loss of Appetite  GU: No Dysuria, Change in Color of Urine, No Urgency or Frequency, No Flank pain.  Musculoskeletal: No Joint Pain or Swelling, No Decreased Range of Motion, No Back Pain.  Neurologic: No Syncope, No Seizures, Muscle Weakness, Paresthesia, Vision  Disturbance or Loss, No Diplopia, No Vertigo, No Difficulty Walking,  Skin: No Rash or Lesions. Psych: No Change in Mood or Affect, No Depression or Anxiety, No Memory loss, No Confusion, or Hallucinations   Past Medical History  Diagnosis Date  . CAD (coronary artery disease)     presumed. LV function normal in 07, did decline to 30-40% in 08 when presented w/cCHF. paroxysmal a-fib w/repaid ventricular response on 2007 and during his current admission (as maintaining currently normal sinus rhythm pt refused Coumadin in the past)  . Chronic renal insufficiency     creatinine varying 2.4-2.9  . Dyslipidemia   . HTN (hypertension)   . Prostate cancer   . DJD (degenerative joint disease)   . Atrial fibrillation       Past Surgical History  Procedure Laterality Date  . Appendectomy    . Hernia repair    . Back surgery    . Dialysis catheter placement  11/09/10    Right neck   ( Done in interventional Radiology)       Prior to Admission medications   Medication Sig Start Date End Date Taking? Authorizing Provider  acetaminophen (TYLENOL) 325 MG tablet Take 650 mg by mouth 2 (two) times daily as needed. For pain    Historical Provider, MD  aspirin 325 MG tablet Take 325 mg by mouth daily.     Historical Provider, MD  atorvastatin (LIPITOR) 10 MG tablet Take 10 mg by mouth at bedtime.    Historical Provider, MD  clopidogrel (PLAVIX) 75 MG tablet Take 75 mg by  mouth daily.     Historical Provider, MD  folic acid-vitamin b complex-vitamin c-selenium-zinc (DIALYVITE) 3 MG TABS Take 1 tablet by mouth daily.    Historical Provider, MD  isosorbide mononitrate (IMDUR) 30 MG 24 hr tablet Take 90 mg by mouth daily.    Historical Provider, MD  lidocaine-prilocaine (EMLA) cream Apply 1 application topically every Monday, Wednesday, and Friday with hemodialysis. Apply to left upper arm fistula 1 hour prior to dialysis    Historical Provider, MD  lisinopril (PRINIVIL,ZESTRIL) 10 MG tablet Take 10  mg by mouth See admin instructions. Takes 10mg  daily at 8 AM on Sundays, Tuesdays, Thursdays, and Saturdays.  Takes 10mg  daily at [facility] on mondays, Wednesdays, and Fridays.    Historical Provider, MD  metoprolol succinate (TOPROL-XL) 100 MG 24 hr tablet Take 100 mg by mouth See admin instructions. Takes 100mg  twice daily at 8 AM and 8PM on Sundays, Tuesdays, Thursdays, and Saturdays.  Takes 100mg  twice daily at 4 PM and 8 PM on Mondays, Wednesdays, and Fridays.    Historical Provider, MD  pantoprazole (PROTONIX) 40 MG tablet Take 40 mg by mouth daily.      Historical Provider, MD      No Known Allergies   Social History:  reports that he has been smoking Cigarettes.  He has a 37.5 pack-year smoking history. He has never used smokeless tobacco. He reports that he does not drink alcohol or use illicit drugs.     Family History  Problem Relation Age of Onset  . Coronary artery disease      family hx  . Heart disease Mother   . Cancer Father        Physical Exam:  GEN:  Pleasant Obese Elderly 80 y.o. Caucasian male examined and in no acute distress; cooperative with exam There were no vitals filed for this visit. There were no vitals taken for this visit. PSYCH: He is alert and oriented x4; does not appear anxious does not appear depressed; affect is normal HEENT: Normocephalic and Atraumatic, Mucous membranes pink; PERRLA; EOM intact; Fundi:  Benign;  No scleral icterus, Nares: Patent, Oropharynx: Clear, Edentulous,    Neck:  FROM, No Cervical Lymphadenopathy nor Thyromegaly or Carotid Bruit; No JVD; Breasts:: Not examined CHEST WALL: No tenderness CHEST: Normal respiration, clear to auscultation bilaterally HEART: Irregulary Iregular Rate and Rhythm,  no murmurs rubs or gallops BACK: No kyphosis or scoliosis; No CVA tenderness ABDOMEN: Positive Bowel Sounds, Obese, Soft Non-Tender; No Masses, No Organomegaly, No Pannus; No Intertriginous candida. Rectal Exam: Not done EXTREMITIES: No  Cyanosis, Clubbing, or Edema; +Ulcerations to the Left Mid-Anterior Tibial area  (1 cm diameter), and  Skin tear to the Right Anterior Tibial area.    Genitalia: not examined PULSES: 2+ and symmetric SKIN: Normal hydration no rash or ulceration:  +Ulcerations to the Left Mid-Anterior Tibial area  (1 cm diameter), and  Skin tear to the Right Anterior Tibial area.   CNS:  Alert and Oriented x 4, Generalized Weakness, NO Focal Deficits other than HOH Vascular: pulses palpable throughout    Labs on Admission:  Basic Metabolic Panel: No results for input(s): NA, K, CL, CO2, GLUCOSE, BUN, CREATININE, CALCIUM, MG, PHOS in the last 168 hours. Liver Function Tests: No results for input(s): AST, ALT, ALKPHOS, BILITOT, PROT, ALBUMIN in the last 168 hours. No results for input(s): LIPASE, AMYLASE in the last 168 hours. No results for input(s): AMMONIA in the last 168 hours. CBC: No results for input(s): WBC, NEUTROABS, HGB,  HCT, MCV, PLT in the last 168 hours. Cardiac Enzymes: No results for input(s): CKTOTAL, CKMB, CKMBINDEX, TROPONINI in the last 168 hours.  BNP (last 3 results) No results for input(s): PROBNP in the last 8760 hours. CBG: No results for input(s): GLUCAP in the last 168 hours.  Radiological Exams on Admission: No results found.   EKG: Independently reviewed.   Atrial Fibrillation with RVR   Assessment/Plan:   80 y.o. male with  Principal Problem:   1.   GI bleeding   Transfused 1 unit  PTA, hypotensive and Tachycardic so Transfusing additional unit   Monitor H/H   Consult GI this AM   Active Problems:   2.   Acute blood loss anemia- Due to #1   Monitor H/Hs   Transfuse PRN        3.   Hypotension- due to Hypovolemia from Blood Loss   Hold BP Meds   Gentle IV F bolus   Transfusions PRN   Monitor for Fluid Overload        4.   Atrial fibrillation with RVR- compensatory due to Hypotension   Give Volume, should improve     5.   PAROXYSMAL ATRIAL  FIBRILLATION   Hx, On Plavix and ASA Rx   Plavix and ASA on Hold due to #1    6.   Chronic systolic CHF (congestive heart failure)   Monitor for Signs and Sxs of Fluid Overload     7.   ESRD on dialysis   Notify Dialysis Team to keep Schedule and     To Dialysis PRN     8.   Pure hypercholesterolemia   Continue Atorvastatin Rx.        9.   PROSTATE CANCER, HX of   Chronic   10.   DVT Prophylaxis   SCDs due to #1      Code Status:   FULL CODE Family Communication:    No Family Present Disposition Plan:  Inpatient       Time spent:  7 [place] C [facility] Pager [phone]   If [place] Please Contact the Day Rounding Team MD for [facility]  If 7PM-7AM, Please Contact Night-Floor Coverage  [email or web address] Password [email or web address] 02/13/2014, 4:50 AM

## 2014-02-13 NOTE — Consult Note (Signed)
Renal Service Consult Note [facility]  Derek Vincent 02/13/2014 Sol Blazing Requesting Physician: Dr Charlies Silvers  Reason for Consult:  ESRD pt with anemia, GIB, on plavix HPI: The patient is a 80 y.o. year-old with hx of HTN, afib, HL, CAD and CHF presented to outside ED yest with dark red bloody stools, weak and dizzy.  No n/v or abd pain. Hb 8.4 initially. Also afib with RVR.  Got IV dilt, NS bolus , 1u prbc's and transferred to [facility].  Seen by GI physicians who noted pt needs prbc transfusion and also plt transfusion (as pt is on plavix) and may be better if he gets the blood products w HD today. Asked to see for same.  Pt has no complaints, vague historian, got hx primarily from pt's daughter.   Gets HD in [place], [place] on MWF schedule. No special HD issues that family is aware of, but they are not really involved much.  Pt lives with his elderly wife (post-CVA) in ALF and the HD unit in [place] is across the street and he is transported via [place].    CXR no active disease            Chart review: 03/2008 - admitted for heart cath (prior NSTEMI in 2007 but had been refusing workup) > had occluded RCA, 80% LAD prox, 90% D1, nonobst disease in LCx. Seen by surgeon but refused bypass surgery. Then refused PCI also and was dc'd home on medical Rx. Creat 2.6 at dc. Other dx's were CKD, chronic syst HF, PAF, remote smoker, PVD, HTN, anemia , hx prost ca   ROS   n/a  Past Medical History  Past Medical History  Diagnosis Date  . CAD (coronary artery disease)     presumed. LV function normal in 07, did decline to 30-40% in 08 when presented w/cCHF. paroxysmal a-fib w/repaid ventricular response on 2007 and during his current admission (as maintaining currently normal sinus rhythm pt refused Coumadin in the past)  . Chronic renal insufficiency     creatinine varying 2.4-2.9  . Dyslipidemia   . HTN (hypertension)   . DJD (degenerative joint disease)   . Atrial fibrillation   .  Dysrhythmia   . Stroke   . COPD (chronic obstructive pulmonary disease)   . ESRD (end stage renal disease) on dialysis     MWF  . Prostate cancer   . Skin cancer   . GERD (gastroesophageal reflux disease)    Past Surgical History  Past Surgical History  Procedure Laterality Date  . Appendectomy    . Hernia repair    . Back surgery    . Dialysis catheter placement  11/09/10    Right neck   ( Done in interventional Radiology)  . Av fistula placement     Family History  Family History  Problem Relation Age of Onset  . Coronary artery disease      family hx  . Heart disease Mother   . Cancer Father    Social History  reports that he has been smoking Cigarettes.  He has a 37.5 pack-year smoking history. He has never used smokeless tobacco. He reports that he does not drink alcohol or use illicit drugs. Allergies No Known Allergies Home medications Prior to Admission medications   Medication Sig Start Date End Date Taking? Authorizing Provider  acetaminophen (TYLENOL) 325 MG tablet Take 650 mg by mouth 2 (two) times daily as needed. For pain    Historical Provider, MD  aspirin 325 MG tablet Take 325 mg by mouth daily.     Historical Provider, MD  atorvastatin (LIPITOR) 10 MG tablet Take 10 mg by mouth at bedtime.    Historical Provider, MD  clopidogrel (PLAVIX) 75 MG tablet Take 75 mg by mouth daily.     Historical Provider, MD  folic acid-vitamin b complex-vitamin c-selenium-zinc (DIALYVITE) 3 MG TABS Take 1 tablet by mouth daily.    Historical Provider, MD  isosorbide mononitrate (IMDUR) 30 MG 24 hr tablet Take 90 mg by mouth daily.    Historical Provider, MD  lidocaine-prilocaine (EMLA) cream Apply 1 application topically every Monday, Wednesday, and Friday with hemodialysis. Apply to left upper arm fistula 1 hour prior to dialysis    Historical Provider, MD  lisinopril (PRINIVIL,ZESTRIL) 10 MG tablet Take 10 mg by mouth See admin instructions. Takes 10mg  daily at 8 AM on Sundays,  Tuesdays, Thursdays, and Saturdays.  Takes 10mg  daily at [facility] on mondays, Wednesdays, and Fridays.    Historical Provider, MD  metoprolol succinate (TOPROL-XL) 100 MG 24 hr tablet Take 100 mg by mouth See admin instructions. Takes 100mg  twice daily at 8 AM and 8PM on Sundays, Tuesdays, Thursdays, and Saturdays.  Takes 100mg  twice daily at 4 PM and 8 PM on Mondays, Wednesdays, and Fridays.    Historical Provider, MD  pantoprazole (PROTONIX) 40 MG tablet Take 40 mg by mouth daily.      Historical Provider, MD   Liver Function Tests No results for input(s): AST, ALT, ALKPHOS, BILITOT, PROT, ALBUMIN in the last 168 hours. No results for input(s): LIPASE, AMYLASE in the last 168 hours. CBC  Recent Labs Lab 02/13/14 0558 02/13/14 1237  WBC 11.9*  --   HGB 6.4* 6.6*  HCT 20.6* 20.6*  MCV 102.0*  --   PLT 250  --    Basic Metabolic Panel  Recent Labs Lab 02/13/14 0558  NA 138  K 5.1  CL 102  CO2 18*  GLUCOSE 101*  BUN 97*  CREATININE 6.17*  CALCIUM 7.3*    Filed Vitals:   02/13/14 0850 02/13/14 0944 02/13/14 1049 02/13/14 1140  BP: 81/44 92/51 96/42  93/48  Pulse: 117  116 106  Temp: 97.7 F (36.5 C) 98.5 F (36.9 C) 97.6 F (36.4 C) 97.8 F (36.6 C)  TempSrc: Oral Oral Oral Oral  Resp: 19 15 18 18   Height:      Weight:      SpO2: 98% 94% 100% 100%   Exam Elderly WM, WDWN, awakens easily, does not participate much verbally No rash, cyanosis or gangrene Sclera anicteric, throat clear Mild jvd Chest mild diffuse exp wheezing ant > post, no rales Irreg rhythm, no rub , M or gallop Abd obese, soft , NTND, +BS, no ascites Pitting 1+ pretib and pedal edema bilat Neuro is nf, Ox 2, gen weakness  Home meds > lisinopril, toprol-XL, protonix, Imdur, MVI, Plavix, Lipitor, asa  HD: MWF [place]    Assessment: 1 GI bleed / anemia 2 ESRD on HD 3 Hypotension - not sure cause, poss d/t #1 4 Hx HTN on lisinopril and toprol-WL at home 5 CAD - refused PCI/ CABG in the past,  stable 6 Hx afib 7 Hx prost ca   Plan- HD today 2.5 - 3 hours , remove 2kg, give prbc's and platelets w Rx. HD tomorrow, 3.5h  Kelly Splinter MD (pgr) [phone]    [phone] 02/13/2014, 2:43 PM

## 2014-02-13 NOTE — Progress Notes (Signed)
[facility] PROGRESS NOTE  Derek Vincent [id number]:[id number] DOB: [birth year] DOA: 02/13/2014 PCP: Manon Hilding, MD   Addendum to admission note done 02/13/2014  80 y.o. male with a history of CAD, Chronic Systolic CHF, ESRD on HD (on MWF), HTN, Atrial Fibrillation and hyperlipidemia who was seen at the [facility] ED for passing dark red bloody stools started on the day of the admission.  On admission, hemoglobin was  6.4. Pt received 1 unit PRBC transfusion prior to transport to [facility]. Additionally, pt was found to have a fib with RVR and has required IV bolus of 10 mg Cardizem.   Assessment/Plan:    Principal Problem: Painless rectal bleed / Acute blood loss anemia / Acute lower GI bleed - possible diverticular bleed - patient received 1 unit PRBC transfusion prior to transport to [facility] Receiving 1 more unit here in SDU. - start protonix 40 mg IV Q 12 hours - appreciate GI consult and recommendations  - hold aspirin and plavix Hypotension - suspect from acute blood loss anemia - continue supportive care with IV fluids - monitor hemodynamic status - hold home antihypertensive meds ESRD on HD - renal informed of patient's admission Atrial fibrillation - HR 106 - hold blood thinners due to bleeding - hold metoprolol due to soft BP   DVT Prophylaxis   SCD's due to risk of bleeding    Code Status: Full.  Family Communication:  plan of care discussed with the patient Disposition Plan: Home when stable.   IV access:   PeripheralIV  Procedures and diagnostic studies:    Dg Chest Port 1v Same Day  02/13/2014   CLINICAL DATA:  Wheezing, COPD. History of coronary artery disease and hypertension  EXAM: PORTABLE CHEST - 1 VIEW SAME DAY  COMPARISON:  02/12/2014  FINDINGS: The heart size and mediastinal contours are within normal limits. Right lung is clear. Both lungs are hypoaerated. Chronic retrocardiac opacity with small left pleural effusion reidentified. Cardiac leads overlie the  chest. The visualized skeletal structures are unremarkable.  IMPRESSION: No significant change or new acute finding.   Electronically Signed   By: Conchita Paris M.D.   On: 02/13/2014 11:50    Medical Consultants:   Nephrology  GI Other Consultants:   None  IAnti-Infectives:    None    Leisa Lenz, MD  [facility] Pager [phone]  If 7PM-7AM, please contact night-coverage [email or web address] Password [facility] 02/13/2014, 3:45 PM   LOS: 0 days    HPI/Subjective: No acute overnight events.  Objective: Filed Vitals:   02/13/14 0850 02/13/14 0944 02/13/14 1049 02/13/14 1140  BP: 81/44 92/51 96/42  93/48  Pulse: 117  116 106  Temp: 97.7 F (36.5 C) 98.5 F (36.9 C) 97.6 F (36.4 C) 97.8 F (36.6 C)  TempSrc: Oral Oral Oral Oral  Resp: 19 15 18 18   Height:      Weight:      SpO2: 98% 94% 100% 100%    Intake/Output Summary (Last 24 hours) at 02/13/14 1545 Last data filed at 02/13/14 1200  Gross per 24 hour  Intake    658 ml  Output      0 ml  Net    658 ml    Exam:   General:  Pt is not in acute distress  Cardiovascular: irregular rhythm, S1/S2 appreciated   Respiratory: Clear to auscultation bilaterally, no wheezing, no crackles, no rhonchi  Abdomen: Soft, non tender, non distended, bowel sounds present  Extremities: +1 pedal edema, pulses DP and PT palpable  bilaterally  Neuro: Grossly nonfocal  Data Reviewed: Basic Metabolic Panel:  Recent Labs Lab 02/13/14 0558  NA 138  K 5.1  CL 102  CO2 18*  GLUCOSE 101*  BUN 97*  CREATININE 6.17*  CALCIUM 7.3*   Liver Function Tests: No results for input(s): AST, ALT, ALKPHOS, BILITOT, PROT, ALBUMIN in the last 168 hours. No results for input(s): LIPASE, AMYLASE in the last 168 hours. No results for input(s): AMMONIA in the last 168 hours. CBC:  Recent Labs Lab 02/13/14 0558 02/13/14 1237  WBC 11.9*  --   HGB 6.4* 6.6*  HCT 20.6* 20.6*  MCV 102.0*  --   PLT 250  --    Cardiac  Enzymes: No results for input(s): CKTOTAL, CKMB, CKMBINDEX, TROPONINI in the last 168 hours. BNP: Invalid input(s): POCBNP CBG: No results for input(s): GLUCAP in the last 168 hours.  Recent Results (from the past 240 hour(s))  MRSA PCR Screening     Status: Abnormal   Collection Time: 02/13/14  3:31 AM  Result Value Ref Range Status   MRSA by PCR POSITIVE (A) NEGATIVE Final    Comment:        The GeneXpert MRSA Assay (FDA approved for NASAL specimens only), is one component of a comprehensive MRSA colonization surveillance program. It is not intended to diagnose MRSA infection nor to guide or monitor treatment for MRSA infections. RESULT CALLED TO, READ BACK BY AND VERIFIED WITH: SHAW,M RN [phone] 02/13/14 MITCHELL,L      Scheduled Meds: . sodium chloride   Intravenous Once  . albumin human  50 g Intravenous Once  . albumin human      . antiseptic oral rinse  7 mL Mouth Rinse BID  . pantoprazole (PROTONIX) IV  40 mg Intravenous Q12H  . sodium chloride  3 mL Intravenous Q12H

## 2014-02-13 NOTE — Progress Notes (Signed)
CRITICAL VALUE ALERT  Critical value received:  Hg 6.4  Date of notification:  02/13/14  Time of notification:  0700  Critical value read back:Yes.    Nurse who received alert:  Raj Janus, RN  MD notified (1st page):  Dr Charlies Silvers  Time of first page:  0705  MD notified (2nd page):  Time of second page:  Responding MD:  Dr. Charlies Silvers  Time MD responded:  705  No changes at this time. Pt is scheduled for 1 unit of RBC.

## 2014-02-13 NOTE — Procedures (Signed)
I was present at this dialysis session, have reviewed the session itself and made  appropriate changes  Kelly Splinter MD (pgr) [phone]    ([phone] 02/13/2014, 5:25 PM

## 2014-02-14 DIAGNOSIS — Z7902 Long term (current) use of antithrombotics/antiplatelets: Secondary | ICD-10-CM

## 2014-02-14 DIAGNOSIS — K5791 Diverticulosis of intestine, part unspecified, without perforation or abscess with bleeding: Secondary | ICD-10-CM

## 2014-02-14 LAB — CBC
HCT: 25.8 % — ABNORMAL LOW (ref 39.0–52.0)
Hemoglobin: 8.5 g/dL — ABNORMAL LOW (ref 13.0–17.0)
MCH: 31.4 pg (ref 26.0–34.0)
MCHC: 32.9 g/dL (ref 30.0–36.0)
MCV: 95.2 fL (ref 78.0–100.0)
Platelets: 232 10*3/uL (ref 150–400)
RBC: 2.71 MIL/uL — ABNORMAL LOW (ref 4.22–5.81)
RDW: 20.6 % — AB (ref 11.5–15.5)
WBC: 10.7 10*3/uL — ABNORMAL HIGH (ref 4.0–10.5)

## 2014-02-14 LAB — PREPARE PLATELET PHERESIS
UNIT DIVISION: 0
Unit division: 0

## 2014-02-14 LAB — HEMOGLOBIN AND HEMATOCRIT, BLOOD
HCT: 24.3 % — ABNORMAL LOW (ref 39.0–52.0)
HEMATOCRIT: 23.5 % — AB (ref 39.0–52.0)
HEMOGLOBIN: 7.7 g/dL — AB (ref 13.0–17.0)
Hemoglobin: 7.9 g/dL — ABNORMAL LOW (ref 13.0–17.0)

## 2014-02-14 LAB — HEPATITIS B SURFACE ANTIGEN: Hepatitis B Surface Ag: NEGATIVE

## 2014-02-14 LAB — PREPARE RBC (CROSSMATCH)

## 2014-02-14 MED ORDER — MUPIROCIN 2 % EX OINT
TOPICAL_OINTMENT | Freq: Two times a day (BID) | CUTANEOUS | Status: DC
  Administered 2014-02-14: 1 via NASAL
  Administered 2014-02-15: 10:00:00 via NASAL
  Filled 2014-02-14: qty 22

## 2014-02-14 MED ORDER — METOPROLOL TARTRATE 25 MG PO TABS
25.0000 mg | ORAL_TABLET | Freq: Two times a day (BID) | ORAL | Status: DC
  Administered 2014-02-14 – 2014-02-15 (×2): 25 mg via ORAL
  Filled 2014-02-14 (×5): qty 1

## 2014-02-14 MED ORDER — SODIUM CHLORIDE 0.9 % IV SOLN: Freq: Once | INTRAVENOUS | Status: DC

## 2014-02-14 NOTE — Procedures (Signed)
Patient was seen on dialysis and the procedure was supervised.  BFR 400  Via AVF BP is  112/51.   Patient appears to be tolerating treatment well  Derek Vincent A 02/14/2014

## 2014-02-14 NOTE — Progress Notes (Addendum)
[place] TEAM 1 - Stepdown/ICU TEAM  Derek Vincent UGQ:[id number] DOB: December 25, 1923 DOA: 02/13/2014 PCP: Manon Hilding, MD  Brief Admit Hx: 80 y.o. male with a history of CAD, Chronic Systolic CHF, ESRD on HD (MWF), HTN, Chronic Atrial Fibrillation, and hyperlipidemia who was seen at the [facility] ED for passing dark red bloody stools beginning the day of the admission.  Hemoglobin was  6.4. Pt received 1 unit PRBC transfusion prior to transport to [facility]. Additionally, pt was found to be in  a fib with RVR and required an IV bolus of 10 mg Cardizem.   HPI/Subjective: Does not think he has passed any more bloody stools this morning.  Is hungry and wishes to eat.  Denies cp, n/v, abdom pain, or sob.    Assessment/Plan:    Painless rectal bleed - Acute lower GI bleed - possible diverticular bleed - appreciate GI consult and recommendations  - hold aspirin and plavix - follow clinically as pt has decided to not undergo any interventions/procedures   Acute blood loss anemia on anemia of chronic kidney disease  - patient received 1 unit PRBC transfusion prior to transport to [facility] + 1 more unit at [facility] thus far - Nephrology has transfused plts as well - to get an additional unit w/ HD today - cont to follow in serial fashion   Chronic Atrial fibrillation w/ acute RVR  - hold blood thinners due to bleeding - hold metoprolol due to soft BP - loaded w/ dig last night due to RVR - do not wish to continue this med long term due to high risk given advanced age and renal failure - will attempt to resume home BB if BP improves w/ transfusions   Hypotension - suspect from acute blood loss anemia - continue supportive care with IV fluids - monitor hemodynamic status in SDU an additional 24hrs  - hold home antihypertensive meds  ESRD on HD - Nephrology following   DVT Prophylaxis  SCDs  Code Status: Full.  Family Communication:  plan of care discussed with the patient - no family present    Disposition Plan: SDU    Procedures and diagnostic studies:    Ct Abdomen Pelvis Wo Contrast  02/13/2014   CLINICAL DATA:  Blood in stool.  Lower abdominal pain.  Nausea.  EXAM: CT ABDOMEN AND PELVIS WITHOUT CONTRAST  TECHNIQUE: Multidetector CT imaging of the abdomen and pelvis was performed following the standard protocol without IV contrast.  COMPARISON:  CT abdomen and pelvis 09/21/2013.  FINDINGS: Small left pleural effusion is again seen with associated pleural thickening. The appearance is not markedly changed. Associated left basilar airspace opacity is also unchanged. Dependent atelectasis in the right base is noted. There is cardiomegaly. Calcific coronary artery disease is identified. Small amount of oral contrast is seen in the distal esophagus consistent with either reflux or esophageal dysmotility.  A punctate calcification in the inferior right hepatic lobe is unchanged. The gallbladder, adrenal glands, spleen, pancreas and biliary tree are unremarkable. The kidneys are markedly atrophic consistent with chronic renal failure. Small hyper attenuating lesions in the lower pole of the right kidney are unchanged and likely represent cysts. Extensive aortoiliac atherosclerosis is identified with unchanged ectasia of the descending abdominal aorta at 2.6 cm in diameter. Bilateral common iliac artery aneurysms measuring 1.6 cm on the left and 1.7 cm on the right are unchanged. There is no hemorrhage.  The patient has extensive diverticular disease. There is new mild stranding of fat in both  the right and left lower quadrants of the abdomen. No focal fluid collection is identified. There is no evidence of bowel ischemia. The stomach and small bowel are unremarkable. No lymphadenopathy is seen. Back containing inguinal hernia is are unchanged. No lytic or sclerotic bony lesion is identified with multilevel lumbar spondylosis noted.  IMPRESSION: New fat stranding in the lower quadrants of the abdomen is  more notable on the left. The finding is nonspecific and could be related to mesenteritis possibly very mild volume overload. Fat stranding is more notable in the left lower quadrant but does not appear associated with the colon as seen in diverticulitis.  Calcific and aortic coronary atherosclerosis with ectasia of the descending abdominal aorta. Ectatic abdominal aorta at risk for aneurysm development. Recommend followup by [place] in 5 years. This recommendation follows ACR consensus guidelines: White Paper of the ACR Incidental Findings Committee II on Vascular Findings. J Am Coll Radiol 2013; 10:789-794.  Bilateral fat containing inguinal hernias.  Chronic renal disease.  Small, chronic left pleural effusion.  Findings compatible with esophageal dysmotility and/or reflux disease.   Electronically Signed   By: Inge Rise M.D.   On: 02/13/2014 22:46   Dg Chest Port 1v Same Day  02/13/2014   CLINICAL DATA:  Wheezing, COPD. History of coronary artery disease and hypertension  EXAM: PORTABLE CHEST - 1 VIEW SAME DAY  COMPARISON:  02/12/2014  FINDINGS: The heart size and mediastinal contours are within normal limits. Right lung is clear. Both lungs are hypoaerated. Chronic retrocardiac opacity with small left pleural effusion reidentified. Cardiac leads overlie the chest. The visualized skeletal structures are unremarkable.  IMPRESSION: No significant change or new acute finding.   Electronically Signed   By: Conchita Paris M.D.   On: 02/13/2014 11:50    Medical Consultants:   Nephrology  GI  IAnti-Infectives:    None   Objective: Filed Vitals:   02/14/14 1254 02/14/14 1300 02/14/14 1330 02/14/14 1400  BP: 121/67 134/72 131/54 120/56  Pulse: 100 113 116 114  Temp:      TempSrc:      Resp:      Height:      Weight:      SpO2:        Intake/Output Summary (Last 24 hours) at 02/14/14 1428 Last data filed at 02/14/14 0800  Gross per 24 hour  Intake   2155 ml  Output    700 ml  Net    1455 ml    Exam: General: No acute respiratory distress Lungs: Clear to auscultation bilaterally without wheezes or crackles Cardiovascular: Regular rate and rhythm without murmur gallop or rub normal S1 and S2 Abdomen: Nontender, nondistended, soft, bowel sounds positive, no rebound, no ascites, no appreciable mass Extremities: No significant cyanosis, clubbing, or edema bilateral lower extremities  Data Reviewed: Basic Metabolic Panel:  Recent Labs Lab 02/13/14 0558  NA 138  K 5.1  CL 102  CO2 18*  GLUCOSE 101*  BUN 97*  CREATININE 6.17*  CALCIUM 7.3*   Liver Function Tests: No results for input(s): AST, ALT, ALKPHOS, BILITOT, PROT, ALBUMIN in the last 168 hours. No results for input(s): LIPASE, AMYLASE in the last 168 hours. No results for input(s): AMMONIA in the last 168 hours.   CBC:  Recent Labs Lab 02/13/14 0558 02/13/14 1237 02/13/14 1830 02/14/14 0310 02/14/14 1105  WBC 11.9*  --   --   --   --   HGB 6.4* 6.6* 8.2* 7.7* 7.9*  HCT 20.6*  20.6* 24.8* 23.5* 24.3*  MCV 102.0*  --   --   --   --   PLT 250  --   --   --   --     Recent Results (from the past 240 hour(s))  MRSA PCR Screening     Status: Abnormal   Collection Time: 02/13/14  3:31 AM  Result Value Ref Range Status   MRSA by PCR POSITIVE (A) NEGATIVE Final    Comment:        The GeneXpert MRSA Assay (FDA approved for NASAL specimens only), is one component of a comprehensive MRSA colonization surveillance program. It is not intended to diagnose MRSA infection nor to guide or monitor treatment for MRSA infections. RESULT CALLED TO, READ BACK BY AND VERIFIED WITH: SHAW,M RN [phone] 02/13/14 MITCHELL,L      Scheduled Meds: . sodium chloride   Intravenous Once  . sodium chloride   Intravenous Once  . antiseptic oral rinse  7 mL Mouth Rinse BID  . [START ON 02/15/2014] digoxin  0.125 mg Intravenous Daily  . pantoprazole (PROTONIX) IV  40 mg Intravenous Q12H  . sodium chloride  3 mL  Intravenous Q12H    Cherene Altes, MD [facility] For Consults/Admissions - Flow Manager - [phone] Office  [phone] Pager [phone]  On-Call/Text Page:      [email or web address]      password [facility]  02/14/2014  2:28 PM  LOS: 1 day

## 2014-02-14 NOTE — Progress Notes (Signed)
Subjective:  Received HD last night with transfusion- removed 2300- had some elevated HR overnight, borderline blood pressure Objective Vital signs in last 24 hours: Filed Vitals:   02/14/14 0400 02/14/14 0500 02/14/14 0600 02/14/14 0700  BP: 94/48 138/55 84/48 119/52  Pulse: 104 124 107 105  Temp:      TempSrc:      Resp: 14 23 14 14   Height:      Weight:      SpO2: 99% 98% 98% 99%   Weight change: 0.6 kg (1 lb 5.2 oz)  Intake/Output Summary (Last 24 hours) at 02/14/14 0539 Last data filed at 02/14/14 0025  Gross per 24 hour  Intake   2103 ml  Output    700 ml  Net   1403 ml    Assessment/ Plan: Pt is a 80 y.o. yo male with ESRD who was admitted on 02/13/2014 with GIB, Afib with RVR Assessment/Plan: 1. GIB- was on plavix- has been stopped- s/p transfusion 11/8- hgb went up from 6.6 to 8.2- but now down this AM to 7.7- CT not remarkable in terms of source of blood loss- GI thinking diverticular bleed, are following 2. ESRD - normally MWF- [facility] via AVF- will do regular treatment today and give another unit of PRBC 3. Anemia- GIB and CKD- no heparin with HD 4. Secondary hyperparathyroidism- dont have any labs or meds from kidney center- will try and get that info today- check at least a phos- says he was told to take tums but does not take  5. HTN/volume- seems OK to low- holding his lisinopril/toprol  - trying to use dig for rate control   Dayvian Blixt A    Labs: Basic Metabolic Panel:  Recent Labs Lab 02/13/14 0558  NA 138  K 5.1  CL 102  CO2 18*  GLUCOSE 101*  BUN 97*  CREATININE 6.17*  CALCIUM 7.3*   Liver Function Tests: No results for input(s): AST, ALT, ALKPHOS, BILITOT, PROT, ALBUMIN in the last 168 hours. No results for input(s): LIPASE, AMYLASE in the last 168 hours. No results for input(s): AMMONIA in the last 168 hours. CBC:  Recent Labs Lab 02/13/14 0558 02/13/14 1237 02/13/14 1830 02/14/14 0310  WBC 11.9*  --   --   --   HGB  6.4* 6.6* 8.2* 7.7*  HCT 20.6* 20.6* 24.8* 23.5*  MCV 102.0*  --   --   --   PLT 250  --   --   --    Cardiac Enzymes: No results for input(s): CKTOTAL, CKMB, CKMBINDEX, TROPONINI in the last 168 hours. CBG: No results for input(s): GLUCAP in the last 168 hours.  Iron Studies: No results for input(s): IRON, TIBC, TRANSFERRIN, FERRITIN in the last 72 hours. Studies/Results: Ct Abdomen Pelvis Wo Contrast  02/13/2014   CLINICAL DATA:  Blood in stool.  Lower abdominal pain.  Nausea.  EXAM: CT ABDOMEN AND PELVIS WITHOUT CONTRAST  TECHNIQUE: Multidetector CT imaging of the abdomen and pelvis was performed following the standard protocol without IV contrast.  COMPARISON:  CT abdomen and pelvis 09/21/2013.  FINDINGS: Small left pleural effusion is again seen with associated pleural thickening. The appearance is not markedly changed. Associated left basilar airspace opacity is also unchanged. Dependent atelectasis in the right base is noted. There is cardiomegaly. Calcific coronary artery disease is identified. Small amount of oral contrast is seen in the distal esophagus consistent with either reflux or esophageal dysmotility.  A punctate calcification in the inferior right hepatic lobe  is unchanged. The gallbladder, adrenal glands, spleen, pancreas and biliary tree are unremarkable. The kidneys are markedly atrophic consistent with chronic renal failure. Small hyper attenuating lesions in the lower pole of the right kidney are unchanged and likely represent cysts. Extensive aortoiliac atherosclerosis is identified with unchanged ectasia of the descending abdominal aorta at 2.6 cm in diameter. Bilateral common iliac artery aneurysms measuring 1.6 cm on the left and 1.7 cm on the right are unchanged. There is no hemorrhage.  The patient has extensive diverticular disease. There is new mild stranding of fat in both the right and left lower quadrants of the abdomen. No focal fluid collection is identified. There  is no evidence of bowel ischemia. The stomach and small bowel are unremarkable. No lymphadenopathy is seen. Back containing inguinal hernia is are unchanged. No lytic or sclerotic bony lesion is identified with multilevel lumbar spondylosis noted.  IMPRESSION: New fat stranding in the lower quadrants of the abdomen is more notable on the left. The finding is nonspecific and could be related to mesenteritis possibly very mild volume overload. Fat stranding is more notable in the left lower quadrant but does not appear associated with the colon as seen in diverticulitis.  Calcific and aortic coronary atherosclerosis with ectasia of the descending abdominal aorta. Ectatic abdominal aorta at risk for aneurysm development. Recommend followup by [place] in 5 years. This recommendation follows ACR consensus guidelines: White Paper of the ACR Incidental Findings Committee II on Vascular Findings. J Am Coll Radiol 2013; 10:789-794.  Bilateral fat containing inguinal hernias.  Chronic renal disease.  Small, chronic left pleural effusion.  Findings compatible with esophageal dysmotility and/or reflux disease.   Electronically Signed   By: Inge Rise M.D.   On: 02/13/2014 22:46   Dg Chest Port 1v Same Day  02/13/2014   CLINICAL DATA:  Wheezing, COPD. History of coronary artery disease and hypertension  EXAM: PORTABLE CHEST - 1 VIEW SAME DAY  COMPARISON:  02/12/2014  FINDINGS: The heart size and mediastinal contours are within normal limits. Right lung is clear. Both lungs are hypoaerated. Chronic retrocardiac opacity with small left pleural effusion reidentified. Cardiac leads overlie the chest. The visualized skeletal structures are unremarkable.  IMPRESSION: No significant change or new acute finding.   Electronically Signed   By: Conchita Paris M.D.   On: 02/13/2014 11:50   Medications: Infusions:    Scheduled Medications: . sodium chloride   Intravenous Once  . antiseptic oral rinse  7 mL Mouth Rinse BID  .  [START ON 02/15/2014] digoxin  0.125 mg Intravenous Daily  . pantoprazole (PROTONIX) IV  40 mg Intravenous Q12H  . sodium chloride  3 mL Intravenous Q12H    have reviewed scheduled and prn medications.  Physical Exam: General: elderly- HOH - frail- says bedbound for a year Heart: tachy Lungs: CBS vilat Abdomen: obese- soft Extremities: some edema Dialysis Access: left upper arm AVF    02/14/2014,8:19 AM  LOS: 1 day

## 2014-02-14 NOTE — Progress Notes (Signed)
Daily Rounding Note  02/14/2014, 8:36 AM  LOS: 1 day   SUBJECTIVE:       Feels well.  No complaints.  Denies SOB, abdominal pain, chest pain.  One BM this AM, darker blood, not large, smear of deep bloody stool last night.  Remote colonoscopy, prefers not to undergo this.  Remote EGD.   OBJECTIVE:         Vital signs in last 24 hours:    Temp:  [97.6 F (36.4 C)-98.6 F (37 C)] 97.8 F (36.6 C) (11/09 0800) Pulse Rate:  [97-138] 113 (11/09 0800) Resp:  [13-26] 20 (11/09 0800) BP: (81-138)/(41-68) 112/51 mmHg (11/09 0800) SpO2:  [94 %-100 %] 99 % (11/09 0800) Weight:  [234 lb 2.1 oz (106.2 kg)-235 lb 3.7 oz (106.7 kg)] 234 lb 2.1 oz (106.2 kg) (11/08 1928) Last BM Date: 02/13/14 Filed Weights   02/13/14 0330 02/13/14 1616 02/13/14 1928  Weight: 233 lb 14.5 oz (106.1 kg) 235 lb 3.7 oz (106.7 kg) 234 lb 2.1 oz (106.2 kg)   General: elderly, comfortable alert.    Heart: Irreg, irreg rate in 1teens. Chest: wheezes throughout.  Wet cough. Abdomen: soft, NT, active BS, obese.    Extremities: no CCE.  Dialysis accesss graft on left UE Neuro/Psych:  Pleasant, alert, HOH, conversant and oriented x 3.   Intake/Output from previous day: 11/08 0701 - 11/09 0700 In: 2103 [P.O.:600; I.V.:140; Blood:1363] Out: 700   Intake/Output this shift: Total I/O In: 460 [P.O.:460] Out: -   Lab Results:  Recent Labs  02/13/14 0558 02/13/14 1237 02/13/14 1830 02/14/14 0310  WBC 11.9*  --   --   --   HGB 6.4* 6.6* 8.2* 7.7*  HCT 20.6* 20.6* 24.8* 23.5*  PLT 250  --   --   --    BMET  Recent Labs  02/13/14 0558  NA 138  K 5.1  CL 102  CO2 18*  GLUCOSE 101*  BUN 97*  CREATININE 6.17*  CALCIUM 7.3*   LFT No results for input(s): PROT, ALBUMIN, AST, ALT, ALKPHOS, BILITOT, BILIDIR, IBILI in the last 72 hours. PT/INR No results for input(s): LABPROT, INR in the last 72 hours. Hepatitis Panel  Recent Labs   02/13/14 1521  HEPBSAG NEGATIVE    Studies/Results: Ct Abdomen Pelvis Wo Contrast 02/13/2014  COMPARISON:  CT abdomen and pelvis 09/21/2013.  FINDINGS: Small left pleural effusion is again seen with associated pleural thickening. The appearance is not markedly changed. Associated left basilar airspace opacity is also unchanged. Dependent atelectasis in the right base is noted. There is cardiomegaly. Calcific coronary artery disease is identified. Small amount of oral contrast is seen in the distal esophagus consistent with either reflux or esophageal dysmotility.  A punctate calcification in the inferior right hepatic lobe is unchanged. The gallbladder, adrenal glands, spleen, pancreas and biliary tree are unremarkable. The kidneys are markedly atrophic consistent with chronic renal failure. Small hyper attenuating lesions in the lower pole of the right kidney are unchanged and likely represent cysts. Extensive aortoiliac atherosclerosis is identified with unchanged ectasia of the descending abdominal aorta at 2.6 cm in diameter. Bilateral common iliac artery aneurysms measuring 1.6 cm on the left and 1.7 cm on the right are unchanged. There is no hemorrhage.  The patient has extensive diverticular disease. There is new mild stranding of fat in both the right and left lower quadrants of the abdomen. No focal fluid collection is identified. There is no  evidence of bowel ischemia. The stomach and small bowel are unremarkable. No lymphadenopathy is seen. Back containing inguinal hernia is are unchanged. No lytic or sclerotic bony lesion is identified with multilevel lumbar spondylosis noted.  IMPRESSION: New fat stranding in the lower quadrants of the abdomen is more notable on the left. The finding is nonspecific and could be related to mesenteritis possibly very mild volume overload. Fat stranding is more notable in the left lower quadrant but does not appear associated with the colon as seen in diverticulitis.   Calcific and aortic coronary atherosclerosis with ectasia of the descending abdominal aorta. Ectatic abdominal aorta at risk for aneurysm development. Recommend followup by [place] in 5 years. This recommendation follows ACR consensus guidelines: White Paper of the ACR Incidental Findings Committee II on Vascular Findings. J Am Coll Radiol 2013; 10:789-794.  Bilateral fat containing inguinal hernias.  Chronic renal disease.  Small, chronic left pleural effusion.  Findings compatible with esophageal dysmotility and/or reflux disease.   Electronically Signed   By: Inge Rise M.D.   On: 02/13/2014 22:46   Dg Chest Port 1v Same Day 02/13/2014    COMPARISON:  02/12/2014  FINDINGS: The heart size and mediastinal contours are within normal limits. Right lung is clear. Both lungs are hypoaerated. Chronic retrocardiac opacity with small left pleural effusion reidentified. Cardiac leads overlie the chest. The visualized skeletal structures are unremarkable.  IMPRESSION: No significant change or new acute finding.   Electronically Signed   By: Conchita Paris M.D.   On: 02/13/2014 11:50   Scheduled Meds: . sodium chloride   Intravenous Once  . sodium chloride   Intravenous Once  . antiseptic oral rinse  7 mL Mouth Rinse BID  . [START ON 02/15/2014] digoxin  0.125 mg Intravenous Daily  . pantoprazole (PROTONIX) IV  40 mg Intravenous Q12H  . sodium chloride  3 mL Intravenous Q12H   Continuous Infusions:  PRN Meds:.sodium chloride, acetaminophen **OR** acetaminophen, HYDROmorphone (DILAUDID) injection, ondansetron **OR** ondansetron (ZOFRAN) IV, oxyCODONE, sodium chloride   ASSESMENT:   *  Painless hematochezia a/w hemodynamic instablility.  Diverticular bleed?.  CT abdomen 11/8: fat stranding left. Extensive diverticular. Right lower quadrants. Aortic ectasia  Bil fat containing inguinal hernias.  Chronic left pleural effusion.  Query esophageal dysmotility and GER?  Stable calcification inferior right  hepatic lobe   *  ABL anemia.  S/p one unit prior to transfer from [place], 2 more at [facility].  1 more unit planned with HD today.   *  Chronic Plavix.  For hx of CVA? On hold. S/p  platelets x 3 to counteract.   *  AFib , RVR:  New issue on admission but had been noted back in 2007.  Refused Coumadin in past.  *  ESRD.  On hemodialysis MWF.    PLAN   *  Per Dr Ardis Hughs. If no plans to endo/colonoscope then would feed.      Azucena Freed  02/14/2014, 8:36 AM Pager: [phone]  ________________________________________________________________________  Velora Heckler GI MD note:  I personally examined the patient, reviewed the data and agree with the assessment and plan described above.  Has extensive pan-colonic diverticulosis on CT scan and his bleeding is consistent with diverticular bleed (painless, red bleeding). The bleeding has slowed and perhaps stopped completely. Agree with one more unit blood today. Will observe.  At his age, co-morbid conditions will not be proceeding with invasive testing (colonoscopy +/- EGD) unless the bleeding persists or recurs.  Primary team should consider not  restarting his plavix, would at least give him 4-5 more days off plavix if it must be restarted.   Owens Loffler, MD [facility] Pager [phone]

## 2014-02-14 NOTE — Care Management Note (Addendum)
    Page 1 of 1   02/15/2014     2:21:37 PM CARE MANAGEMENT NOTE 02/15/2014  Patient:  Derek Vincent, Derek Vincent   Account Number:  [id number]  Date Initiated:  02/14/2014  Documentation initiated by:  Elissa Hefty  Subjective/Objective Assessment:   adm w gi bleed     Action/Plan:   lives at [place], pcp dr Eddie Dibbles sasser, has  wife   Anticipated DC Date:  02/15/2014   Anticipated DC Plan:  ASSISTED LIVING / REST HOME  In-house referral  Clinical Social Worker         Choice offered to / List presented to:             Status of service:   Medicare Important Message given?   (If response is "NO", the following Medicare IM given date fields will be blank) Date Medicare IM given:   Medicare IM given by:   Date Additional Medicare IM given:   Additional Medicare IM given by:    Discharge Disposition:  ASSISTED LIVING  Per UR Regulation:  Reviewed for med. necessity/level of care/duration of stay  If discussed at [place] of Stay Meetings, dates discussed:    Comments:  11/10 1420 debbie Godwin Tedesco rn,bsn pt lives at [facility] in [place]. he uses w/c there. sw ref made.

## 2014-02-15 ENCOUNTER — Encounter (HOSPITAL_COMMUNITY): Payer: Self-pay | Admitting: *Deleted

## 2014-02-15 DIAGNOSIS — Z720 Tobacco use: Secondary | ICD-10-CM | POA: Insufficient documentation

## 2014-02-15 DIAGNOSIS — R062 Wheezing: Secondary | ICD-10-CM

## 2014-02-15 LAB — CBC
HCT: 25.6 % — ABNORMAL LOW (ref 39.0–52.0)
Hemoglobin: 8.3 g/dL — ABNORMAL LOW (ref 13.0–17.0)
MCH: 30.5 pg (ref 26.0–34.0)
MCHC: 32.4 g/dL (ref 30.0–36.0)
MCV: 94.1 fL (ref 78.0–100.0)
PLATELETS: 238 10*3/uL (ref 150–400)
RBC: 2.72 MIL/uL — AB (ref 4.22–5.81)
RDW: 20.4 % — AB (ref 11.5–15.5)
WBC: 8.2 10*3/uL (ref 4.0–10.5)

## 2014-02-15 LAB — TYPE AND SCREEN
ABO/RH(D): O NEG
ANTIBODY SCREEN: NEGATIVE
UNIT DIVISION: 0
UNIT DIVISION: 0
UNIT DIVISION: 0
Unit division: 0

## 2014-02-15 LAB — COMPREHENSIVE METABOLIC PANEL
ALT: 14 U/L (ref 0–53)
ANION GAP: 13 (ref 5–15)
AST: 29 U/L (ref 0–37)
Albumin: 2.9 g/dL — ABNORMAL LOW (ref 3.5–5.2)
Alkaline Phosphatase: 63 U/L (ref 39–117)
BUN: 25 mg/dL — ABNORMAL HIGH (ref 6–23)
CALCIUM: 7.8 mg/dL — AB (ref 8.4–10.5)
CO2: 29 mEq/L (ref 19–32)
Chloride: 99 mEq/L (ref 96–112)
Creatinine, Ser: 3.22 mg/dL — ABNORMAL HIGH (ref 0.50–1.35)
GFR, EST AFRICAN AMERICAN: 18 mL/min — AB (ref >90)
GFR, EST NON AFRICAN AMERICAN: 16 mL/min — AB (ref >90)
GLUCOSE: 90 mg/dL (ref 70–99)
Potassium: 3.9 mEq/L (ref 3.7–5.3)
Sodium: 141 mEq/L (ref 137–147)
Total Bilirubin: 0.8 mg/dL (ref 0.3–1.2)
Total Protein: 5.7 g/dL — ABNORMAL LOW (ref 6.0–8.3)

## 2014-02-15 LAB — TSH: TSH: 1.9 u[IU]/mL (ref 0.350–4.500)

## 2014-02-15 LAB — MAGNESIUM: MAGNESIUM: 1.8 mg/dL (ref 1.5–2.5)

## 2014-02-15 MED ORDER — PANTOPRAZOLE SODIUM 40 MG PO TBEC: 40.0000 mg | DELAYED_RELEASE_TABLET | Freq: Two times a day (BID) | ORAL | Status: DC

## 2014-02-15 MED ORDER — PANTOPRAZOLE SODIUM 40 MG PO TBEC: 40.0000 mg | DELAYED_RELEASE_TABLET | Freq: Two times a day (BID) | ORAL | Status: AC

## 2014-02-15 MED ORDER — METOPROLOL TARTRATE 25 MG PO TABS: 25.0000 mg | ORAL_TABLET | Freq: Two times a day (BID) | ORAL | Status: AC

## 2014-02-15 MED ORDER — METOPROLOL TARTRATE 25 MG PO TABS: 25.0000 mg | ORAL_TABLET | Freq: Two times a day (BID) | ORAL | Status: DC

## 2014-02-15 NOTE — Plan of Care (Signed)
Problem: Discharge Progression Outcomes Goal: Tolerating diet Outcome: Completed/Met Date Met:  02/15/14     

## 2014-02-15 NOTE — Progress Notes (Signed)
Late Entry:  CSW received a call from the [facility] this afternoon to notify that patient is a resident of [facility] and that MD wants patient to return there today.  CSW initiated an FL2 and sent clinicals to [place] at the [facility] for review. She was concerned about patient returning so late in the day due to limited staffing issues and stated that they would not be able to pick up patient as they only transport within a 15 mile radius. CSW spoke throughout the afternoon with her daughter Juliann Pulse [phone] about dc. She was very upset at first as she thought patient would be moved from 2 H to a med/surg floor prior to d/c and felt that he was not medically stable for d/c. CSW discussed with Dr. Sherral Hammers who felt that patient is medically stable for d/c.; CSW also spoke to Dr. Ardis Hughs, Gastroenterology who agreed to speak with patient's daughter because she was concerned that she did not feel she was informed about her father's condition. Dr. Eugenia Pancoast contacted daughter and she was very appreciative and felt that it would be ok for her father's release. She lives 2 hours away so arranged for patient's step-daughter to come and pick up her father to return to the ALF.  Discussed with patient's nurse and with patient. He is alert, oriented and very happy to be leaving today. After a discussion with MD- patient requested to have code status changed to DNR.  The out of facility DNR was signed by Dr. Sherral Hammers and sent with patient to facility.  Fl2 completed and copy placed on chart.  No further needs or questions identified by patient or daughter- CSW signing off.  Lorie Phenix. Pauline Good, [place]

## 2014-02-15 NOTE — Plan of Care (Signed)
Problem: Discharge Progression Outcomes Goal: [place] arrangements in place Outcome: Not Applicable Date Met:  34/74/25

## 2014-02-15 NOTE — Plan of Care (Signed)
Problem: Phase II Progression Outcomes Goal: Tolerating diet Outcome: Completed/Met Date Met:  02/15/14     

## 2014-02-15 NOTE — Plan of Care (Signed)
Problem: Discharge Progression Outcomes Goal: Stools guaiac negative Outcome: Not Applicable Date Met:  71/21/97

## 2014-02-15 NOTE — Discharge Summary (Addendum)
Physician Discharge Summary  Derek Vincent:[id number] DOB: 18-Aug-1923 DOA: 02/13/2014  PCP: Manon Hilding, MD  Admit date: 02/13/2014 Discharge date: 02/15/2014  Time spent:40 minutes  Recommendations for Outpatient Follow-up:  Painless rectal bleed - Acute lower GI bleed - Most likely diverticular bleed exacerbated by Anticoagulants  - GI recommends Holding Plavix(Never restarting?). Counseled Pt to hold for minimum of (2) weeks, and then to discuss not restarting.     - hold aspirin and plavix F/U with Cardiologist in 1-2 weeks  -Continue on Protonix 40mg  BID -Discussed DNR vs Full Code and Pt wishes to be made DNR   Acute blood loss anemia on anemia of chronic kidney disease  - During Hospitalization patient received 1 unit PRBC transfusion prior to transport to [facility] + 1 more unit at [facility]  -Nephrology has transfused Platelets at HD. -H/H stable   Chronic Atrial fibrillation w/ acute RVR  - hold blood thinners due to bleeding. Address with Cardiologist if any Anticoagulation should be restarted at F/U  -Continue metoprolol 25mg  BID  Hypotension - Stable, asymptomatic   ESRD on HD M/W/F - Continue previous schedule of  HD on M/W/F   Tobacco Abuse  - Counseled he should stop immediately, however Pt states will stop when his toes are pointed up and he is in a box      Discharge Diagnoses:  Principal Problem:   GI bleeding Active Problems:   PAROXYSMAL ATRIAL FIBRILLATION   PROSTATE CANCER, HX OF   Chronic systolic CHF (congestive heart failure)   Atrial fibrillation with RVR   Hypotension   Acute blood loss anemia   ESRD on dialysis   Pure hypercholesterolemia   Hematochezia   Antiplatelet or antithrombotic long-term use   Lower GI bleed   Discharge Condition: stable   Diet recommendation: Heart Healthy/ Renal Diet  Filed Weights   02/13/14 1928 02/14/14 1233 02/14/14 1623  Weight: 106.2 kg (234 lb 2.1 oz) 106.5 kg (234 lb 12.6 oz) 104.2 kg (229 lb 11.5  oz)    History of present illness:  80 y.o. WM  PMHx CAD, Chronic Systolic CHF, ESRD on HD (M/W/F), HTN, Chronic Atrial Fibrillation on chronic Anticoagulation, and hyperlipidemia who was seen at the [facility] ED for passing dark red bloody stools beginning the day of the admission. Hemoglobin was 6.4. Pt received 1 unit PRBC transfusion prior to transport to [facility]. Additionally, pt was found to be in a fib with RVR and required an IV bolus of 10 mg Cardizem.     Procedures: None   Consultations: Dr Corliss Parish (Nephrology) Dr Owens Loffler (GI)  Antibiotics None   Discharge Exam: Filed Vitals:   02/15/14 0800 02/15/14 0932 02/15/14 1155 02/15/14 1200  BP: 121/58 116/55 111/68 94/58  Pulse: 104 105 93 103  Temp:   98.4 F (36.9 C)   TempSrc:   Oral   Resp: 22  15 22   Height:      Weight:      SpO2: 96%  100% 100%    General: A/O x4,  Cardiovascular: Irregular Irregular Rhythum and Rate, (-) M/R/G, Normal S1/S2 Respiratory: Diffuse Inspiratory /Expitory wheezing, (-) Rhonchi/Rales   Discharge Instructions     Medication List    STOP taking these medications        aspirin 325 MG tablet     aspirin EC 81 MG tablet     clopidogrel 75 MG tablet  Commonly known as:  PLAVIX     isosorbide mononitrate 30 MG 24  hr tablet  Commonly known as:  IMDUR     lisinopril 10 MG tablet  Commonly known as:  PRINIVIL,ZESTRIL     metoprolol succinate 100 MG 24 hr tablet  Commonly known as:  TOPROL-XL     metoprolol succinate 50 MG 24 hr tablet  Commonly known as:  TOPROL-XL      TAKE these medications        acetaminophen 325 MG tablet  Commonly known as:  TYLENOL  Take 650 mg by mouth 2 (two) times daily as needed (pain). For pain     albuterol 108 (90 BASE) MCG/ACT inhaler  Commonly known as:  PROVENTIL HFA;VENTOLIN HFA  Inhale 1-2 puffs into the lungs every 4 (four) hours as needed for wheezing or shortness of breath.     atorvastatin 10 MG tablet   Commonly known as:  LIPITOR  Take 10 mg by mouth at bedtime.     calcium carbonate 500 MG chewable tablet  Commonly known as:  TUMS - dosed in mg elemental calcium  Chew 2 tablets by mouth See admin instructions. Take 2 tabs three times daily with meals and 2 tabs with each snack     folic acid-vitamin b complex-vitamin c-selenium-zinc 3 MG Tabs tablet  Take 1 tablet by mouth daily.     HYDROcodone-acetaminophen 5-325 MG per tablet  Commonly known as:  NORCO/VICODIN  Take 1 tablet by mouth every 4 (four) hours as needed (pain).     lidocaine-prilocaine cream  Commonly known as:  EMLA  Apply 1 application topically every Monday, Wednesday, and Friday with hemodialysis. Apply to left upper arm fistula 1 hour prior to dialysis     metoprolol tartrate 25 MG tablet  Commonly known as:  LOPRESSOR  Take 1 tablet (25 mg total) by mouth 2 (two) times daily.     pantoprazole 40 MG tablet  Commonly known as:  PROTONIX  Take 1 tablet (40 mg total) by mouth 2 (two) times daily.     sorbitol 70 % solution  Take 30 mLs by mouth daily as needed (constipation).       No Known Allergies    The results of significant diagnostics from this hospitalization (including imaging, microbiology, ancillary and laboratory) are listed below for reference.    Significant Diagnostic Studies: Ct Abdomen Pelvis Wo Contrast  02/13/2014   CLINICAL DATA:  Blood in stool.  Lower abdominal pain.  Nausea.  EXAM: CT ABDOMEN AND PELVIS WITHOUT CONTRAST  TECHNIQUE: Multidetector CT imaging of the abdomen and pelvis was performed following the standard protocol without IV contrast.  COMPARISON:  CT abdomen and pelvis 09/21/2013.  FINDINGS: Small left pleural effusion is again seen with associated pleural thickening. The appearance is not markedly changed. Associated left basilar airspace opacity is also unchanged. Dependent atelectasis in the right base is noted. There is cardiomegaly. Calcific coronary artery disease  is identified. Small amount of oral contrast is seen in the distal esophagus consistent with either reflux or esophageal dysmotility.  A punctate calcification in the inferior right hepatic lobe is unchanged. The gallbladder, adrenal glands, spleen, pancreas and biliary tree are unremarkable. The kidneys are markedly atrophic consistent with chronic renal failure. Small hyper attenuating lesions in the lower pole of the right kidney are unchanged and likely represent cysts. Extensive aortoiliac atherosclerosis is identified with unchanged ectasia of the descending abdominal aorta at 2.6 cm in diameter. Bilateral common iliac artery aneurysms measuring 1.6 cm on the left and 1.7 cm on the right are  unchanged. There is no hemorrhage.  The patient has extensive diverticular disease. There is new mild stranding of fat in both the right and left lower quadrants of the abdomen. No focal fluid collection is identified. There is no evidence of bowel ischemia. The stomach and small bowel are unremarkable. No lymphadenopathy is seen. Back containing inguinal hernia is are unchanged. No lytic or sclerotic bony lesion is identified with multilevel lumbar spondylosis noted.  IMPRESSION: New fat stranding in the lower quadrants of the abdomen is more notable on the left. The finding is nonspecific and could be related to mesenteritis possibly very mild volume overload. Fat stranding is more notable in the left lower quadrant but does not appear associated with the colon as seen in diverticulitis.  Calcific and aortic coronary atherosclerosis with ectasia of the descending abdominal aorta. Ectatic abdominal aorta at risk for aneurysm development. Recommend followup by [place] in 5 years. This recommendation follows ACR consensus guidelines: White Paper of the ACR Incidental Findings Committee II on Vascular Findings. J Am Coll Radiol 2013; 10:789-794.  Bilateral fat containing inguinal hernias.  Chronic renal disease.  Small, chronic  left pleural effusion.  Findings compatible with esophageal dysmotility and/or reflux disease.   Electronically Signed   By: Inge Rise M.D.   On: 02/13/2014 22:46   Dg Chest Port 1v Same Day  02/13/2014   CLINICAL DATA:  Wheezing, COPD. History of coronary artery disease and hypertension  EXAM: PORTABLE CHEST - 1 VIEW SAME DAY  COMPARISON:  02/12/2014  FINDINGS: The heart size and mediastinal contours are within normal limits. Right lung is clear. Both lungs are hypoaerated. Chronic retrocardiac opacity with small left pleural effusion reidentified. Cardiac leads overlie the chest. The visualized skeletal structures are unremarkable.  IMPRESSION: No significant change or new acute finding.   Electronically Signed   By: Conchita Paris M.D.   On: 02/13/2014 11:50    Microbiology: Recent Results (from the past 240 hour(s))  MRSA PCR Screening     Status: Abnormal   Collection Time: 02/13/14  3:31 AM  Result Value Ref Range Status   MRSA by PCR POSITIVE (A) NEGATIVE Final    Comment:        The GeneXpert MRSA Assay (FDA approved for NASAL specimens only), is one component of a comprehensive MRSA colonization surveillance program. It is not intended to diagnose MRSA infection nor to guide or monitor treatment for MRSA infections. RESULT CALLED TO, READ BACK BY AND VERIFIED WITH: SHAW,M RN [phone] 02/13/14 MITCHELL,L      Labs: Basic Metabolic Panel:  Recent Labs Lab 02/13/14 0558 02/15/14 0234  NA 138 141  K 5.1 3.9  CL 102 99  CO2 18* 29  GLUCOSE 101* 90  BUN 97* 25*  CREATININE 6.17* 3.22*  CALCIUM 7.3* 7.8*  MG  --  1.8   Liver Function Tests:  Recent Labs Lab 02/15/14 0234  AST 29  ALT 14  ALKPHOS 63  BILITOT 0.8  PROT 5.7*  ALBUMIN 2.9*   No results for input(s): LIPASE, AMYLASE in the last 168 hours. No results for input(s): AMMONIA in the last 168 hours. CBC:  Recent Labs Lab 02/13/14 0558  02/13/14 1830 02/14/14 0310 02/14/14 1105 02/14/14 1917  02/15/14 0234  WBC 11.9*  --   --   --   --  10.7* 8.2  HGB 6.4*  < > 8.2* 7.7* 7.9* 8.5* 8.3*  HCT 20.6*  < > 24.8* 23.5* 24.3* 25.8* 25.6*  MCV 102.0*  --   --   --   --  95.2 94.1  PLT 250  --   --   --   --  232 238  < > = values in this interval not displayed. Cardiac Enzymes: No results for input(s): CKTOTAL, CKMB, CKMBINDEX, TROPONINI in the last 168 hours. BNP: BNP (last 3 results) No results for input(s): PROBNP in the last 8760 hours. CBG: No results for input(s): GLUCAP in the last 168 hours.     Signed:  Dia Crawford, MD [facility] [phone] pager

## 2014-02-15 NOTE — Progress Notes (Signed)
Subjective:  Received HD again last night - removed 2200 but tolerated it well - had some elevated HR overnight, borderline blood pressure again " i want to get out of here" Objective Vital signs in last 24 hours: Filed Vitals:   02/14/14 2315 02/15/14 0018 02/15/14 0408 02/15/14 0747  BP: 102/43 95/73 91/52  98/58  Pulse: 97 94 68 106  Temp: 98.2 F (36.8 C)  98.4 F (36.9 C) 98.4 F (36.9 C)  TempSrc: Oral  Oral Oral  Resp: 21 24 26 23   Height:      Weight:      SpO2: 99% 100% 94% 94%   Weight change: -0.2 kg (-7.1 oz)  Intake/Output Summary (Last 24 hours) at 02/15/14 0802 Last data filed at 02/14/14 2000  Gross per 24 hour  Intake    730 ml  Output   2250 ml  Net  -1520 ml    Assessment/ Plan: Pt is a 80 y.o. yo male with ESRD who was admitted on 02/13/2014 with GIB, Afib with RVR Assessment/Plan: 1. GIB- was on plavix- has been stopped- s/p transfusion 11/8- hgb went up from 6.6 to 8.2- then down, given another unit-  CT not remarkable in terms of source of blood loss- GI thinking diverticular bleed, are following- attempting conservative appoach 2. ESRD - normally MWF- [facility] via AVF- s/p HD yest- next due tomorrow 3. Anemia- GIB and CKD- no heparin with HD 4. Secondary hyperparathyroidism- dont have any labs or meds from kidney center- will try and get that info today- check at least a phos- says he was told to take tums but does not take - corrected calcium is OK 5. HTN/volume- seems OK to low- holding his lisinopril  - previously trying to use dig for rate control - now on beta blocker  Valerye Kobus A    Labs: Basic Metabolic Panel:  Recent Labs Lab 02/13/14 0558 02/15/14 0234  NA 138 141  K 5.1 3.9  CL 102 99  CO2 18* 29  GLUCOSE 101* 90  BUN 97* 25*  CREATININE 6.17* 3.22*  CALCIUM 7.3* 7.8*   Liver Function Tests:  Recent Labs Lab 02/15/14 0234  AST 29  ALT 14  ALKPHOS 63  BILITOT 0.8  PROT 5.7*  ALBUMIN 2.9*   No results for  input(s): LIPASE, AMYLASE in the last 168 hours. No results for input(s): AMMONIA in the last 168 hours. CBC:  Recent Labs Lab 02/13/14 0558  02/14/14 1105 02/14/14 1917 02/15/14 0234  WBC 11.9*  --   --  10.7* 8.2  HGB 6.4*  < > 7.9* 8.5* 8.3*  HCT 20.6*  < > 24.3* 25.8* 25.6*  MCV 102.0*  --   --  95.2 94.1  PLT 250  --   --  232 238  < > = values in this interval not displayed. Cardiac Enzymes: No results for input(s): CKTOTAL, CKMB, CKMBINDEX, TROPONINI in the last 168 hours. CBG: No results for input(s): GLUCAP in the last 168 hours.  Iron Studies: No results for input(s): IRON, TIBC, TRANSFERRIN, FERRITIN in the last 72 hours. Studies/Results: Ct Abdomen Pelvis Wo Contrast  02/13/2014   CLINICAL DATA:  Blood in stool.  Lower abdominal pain.  Nausea.  EXAM: CT ABDOMEN AND PELVIS WITHOUT CONTRAST  TECHNIQUE: Multidetector CT imaging of the abdomen and pelvis was performed following the standard protocol without IV contrast.  COMPARISON:  CT abdomen and pelvis 09/21/2013.  FINDINGS: Small left pleural effusion is again seen with associated pleural thickening.  The appearance is not markedly changed. Associated left basilar airspace opacity is also unchanged. Dependent atelectasis in the right base is noted. There is cardiomegaly. Calcific coronary artery disease is identified. Small amount of oral contrast is seen in the distal esophagus consistent with either reflux or esophageal dysmotility.  A punctate calcification in the inferior right hepatic lobe is unchanged. The gallbladder, adrenal glands, spleen, pancreas and biliary tree are unremarkable. The kidneys are markedly atrophic consistent with chronic renal failure. Small hyper attenuating lesions in the lower pole of the right kidney are unchanged and likely represent cysts. Extensive aortoiliac atherosclerosis is identified with unchanged ectasia of the descending abdominal aorta at 2.6 cm in diameter. Bilateral common iliac artery  aneurysms measuring 1.6 cm on the left and 1.7 cm on the right are unchanged. There is no hemorrhage.  The patient has extensive diverticular disease. There is new mild stranding of fat in both the right and left lower quadrants of the abdomen. No focal fluid collection is identified. There is no evidence of bowel ischemia. The stomach and small bowel are unremarkable. No lymphadenopathy is seen. Back containing inguinal hernia is are unchanged. No lytic or sclerotic bony lesion is identified with multilevel lumbar spondylosis noted.  IMPRESSION: New fat stranding in the lower quadrants of the abdomen is more notable on the left. The finding is nonspecific and could be related to mesenteritis possibly very mild volume overload. Fat stranding is more notable in the left lower quadrant but does not appear associated with the colon as seen in diverticulitis.  Calcific and aortic coronary atherosclerosis with ectasia of the descending abdominal aorta. Ectatic abdominal aorta at risk for aneurysm development. Recommend followup by [place] in 5 years. This recommendation follows ACR consensus guidelines: White Paper of the ACR Incidental Findings Committee II on Vascular Findings. J Am Coll Radiol 2013; 10:789-794.  Bilateral fat containing inguinal hernias.  Chronic renal disease.  Small, chronic left pleural effusion.  Findings compatible with esophageal dysmotility and/or reflux disease.   Electronically Signed   By: Inge Rise M.D.   On: 02/13/2014 22:46   Dg Chest Port 1v Same Day  02/13/2014   CLINICAL DATA:  Wheezing, COPD. History of coronary artery disease and hypertension  EXAM: PORTABLE CHEST - 1 VIEW SAME DAY  COMPARISON:  02/12/2014  FINDINGS: The heart size and mediastinal contours are within normal limits. Right lung is clear. Both lungs are hypoaerated. Chronic retrocardiac opacity with small left pleural effusion reidentified. Cardiac leads overlie the chest. The visualized skeletal structures are  unremarkable.  IMPRESSION: No significant change or new acute finding.   Electronically Signed   By: Conchita Paris M.D.   On: 02/13/2014 11:50   Medications: Infusions:    Scheduled Medications: . antiseptic oral rinse  7 mL Mouth Rinse BID  . metoprolol tartrate  25 mg Oral BID  . mupirocin ointment   Nasal BID  . pantoprazole (PROTONIX) IV  40 mg Intravenous Q12H  . sodium chloride  3 mL Intravenous Q12H    have reviewed scheduled and prn medications.  Physical Exam: General: elderly- HOH - frail- says bedbound for a year Heart: tachy Lungs: CBS vilat Abdomen: obese- soft Extremities: some edema Dialysis Access: left upper arm AVF    02/15/2014,8:02 AM  LOS: 2 days

## 2014-02-15 NOTE — Plan of Care (Signed)
Problem: Discharge Progression Outcomes Goal: Activity appropriate for discharge plan Outcome: Completed/Met Date Met:  02/15/14     

## 2014-02-15 NOTE — Plan of Care (Signed)
Problem: Discharge Progression Outcomes Goal: Barriers To Progression Addressed/Resolved Outcome: Not Applicable Date Met:  20/94/70

## 2014-02-15 NOTE — Progress Notes (Signed)
D/c home by wheelchair,  Stable. Discharge instructions and prescription given to step-daughter, belongings with pt.

## 2014-02-15 NOTE — Plan of Care (Signed)
Problem: Discharge Progression Outcomes Goal: Hemodynamically stable Outcome: Completed/Met Date Met:  02/15/14     

## 2014-02-15 NOTE — Progress Notes (Signed)
Ready to go home, still waiting for his step -daughter to come and pick him  up.

## 2014-02-15 NOTE — Plan of Care (Signed)
Problem: Discharge Progression Outcomes Goal: Pain controlled with appropriate interventions Outcome: Completed/Met Date Met:  02/15/14     

## 2014-02-15 NOTE — Plan of Care (Signed)
Problem: Discharge Progression Outcomes Goal: Discharge plan in place and appropriate Outcome: Completed/Met Date Met:  02/15/14

## 2014-02-15 NOTE — Progress Notes (Addendum)
Daily Rounding Note  02/15/2014, 10:12 AM  LOS: 2 days   SUBJECTIVE:       No BMs or stools.  Feels well.  On clears.  OBJECTIVE:         Vital signs in last 24 hours:    Temp:  [97.8 F (36.6 C)-98.6 F (37 C)] 98.4 F (36.9 C) (11/10 0747) Pulse Rate:  [68-116] 105 (11/10 0932) Resp:  [19-27] 22 (11/10 0800) BP: (91-134)/(39-73) 116/55 mmHg (11/10 0932) SpO2:  [94 %-100 %] 96 % (11/10 0800) Weight:  [229 lb 11.5 oz (104.2 kg)-234 lb 12.6 oz (106.5 kg)] 229 lb 11.5 oz (104.2 kg) (11/09 1623) Last BM Date: 02/13/14 Filed Weights   02/13/14 1928 02/14/14 1233 02/14/14 1623  Weight: 234 lb 2.1 oz (106.2 kg) 234 lb 12.6 oz (106.5 kg) 229 lb 11.5 oz (104.2 kg)   General: looks unwell, not acutely   Heart: RRR Chest: productive cough. Wheezes and ronchi.  Abdomen: protuberant but soft.    Extremities: no CCE Neuro/Psych:  Pleasant, alert.    Intake/Output from previous day: 11/09 0701 - 11/10 0700 In: 1190 [P.O.:940; Blood:250] Out: 2250   Intake/Output this shift: Total I/O In: 100 [P.O.:100] Out: -   Lab Results:  Recent Labs  02/13/14 0558  02/14/14 1105 02/14/14 1917 02/15/14 0234  WBC 11.9*  --   --  10.7* 8.2  HGB 6.4*  < > 7.9* 8.5* 8.3*  HCT 20.6*  < > 24.3* 25.8* 25.6*  PLT 250  --   --  232 238  < > = values in this interval not displayed. BMET  Recent Labs  02/13/14 0558 02/15/14 0234  NA 138 141  K 5.1 3.9  CL 102 99  CO2 18* 29  GLUCOSE 101* 90  BUN 97* 25*  CREATININE 6.17* 3.22*  CALCIUM 7.3* 7.8*   LFT  Recent Labs  02/15/14 0234  PROT 5.7*  ALBUMIN 2.9*  AST 29  ALT 14  ALKPHOS 63  BILITOT 0.8   PT/INR No results for input(s): LABPROT, INR in the last 72 hours. Hepatitis Panel  Recent Labs  02/13/14 1521  HEPBSAG NEGATIVE    Studies/Results: Ct Abdomen Pelvis Wo Contrast  02/13/2014   CLINICAL DATA:  Blood in stool.  Lower abdominal pain.  Nausea.   EXAM: CT ABDOMEN AND PELVIS WITHOUT CONTRAST  TECHNIQUE: Multidetector CT imaging of the abdomen and pelvis was performed following the standard protocol without IV contrast.  COMPARISON:  CT abdomen and pelvis 09/21/2013.  FINDINGS: Small left pleural effusion is again seen with associated pleural thickening. The appearance is not markedly changed. Associated left basilar airspace opacity is also unchanged. Dependent atelectasis in the right base is noted. There is cardiomegaly. Calcific coronary artery disease is identified. Small amount of oral contrast is seen in the distal esophagus consistent with either reflux or esophageal dysmotility.  A punctate calcification in the inferior right hepatic lobe is unchanged. The gallbladder, adrenal glands, spleen, pancreas and biliary tree are unremarkable. The kidneys are markedly atrophic consistent with chronic renal failure. Small hyper attenuating lesions in the lower pole of the right kidney are unchanged and likely represent cysts. Extensive aortoiliac atherosclerosis is identified with unchanged ectasia of the descending abdominal aorta at 2.6 cm in diameter. Bilateral common iliac artery aneurysms measuring 1.6 cm on the left and 1.7 cm on the right are unchanged. There is no hemorrhage.  The patient has extensive diverticular disease. There  is new mild stranding of fat in both the right and left lower quadrants of the abdomen. No focal fluid collection is identified. There is no evidence of bowel ischemia. The stomach and small bowel are unremarkable. No lymphadenopathy is seen. Back containing inguinal hernia is are unchanged. No lytic or sclerotic bony lesion is identified with multilevel lumbar spondylosis noted.  IMPRESSION: New fat stranding in the lower quadrants of the abdomen is more notable on the left. The finding is nonspecific and could be related to mesenteritis possibly very mild volume overload. Fat stranding is more notable in the left lower  quadrant but does not appear associated with the colon as seen in diverticulitis.  Calcific and aortic coronary atherosclerosis with ectasia of the descending abdominal aorta. Ectatic abdominal aorta at risk for aneurysm development. Recommend followup by [place] in 5 years. This recommendation follows ACR consensus guidelines: White Paper of the ACR Incidental Findings Committee II on Vascular Findings. J Am Coll Radiol 2013; 10:789-794.  Bilateral fat containing inguinal hernias.  Chronic renal disease.  Small, chronic left pleural effusion.  Findings compatible with esophageal dysmotility and/or reflux disease.   Electronically Signed   By: Inge Rise M.D.   On: 02/13/2014 22:46   Dg Chest Port 1v Same Day  02/13/2014   CLINICAL DATA:  Wheezing, COPD. History of coronary artery disease and hypertension  EXAM: PORTABLE CHEST - 1 VIEW SAME DAY  COMPARISON:  02/12/2014  FINDINGS: The heart size and mediastinal contours are within normal limits. Right lung is clear. Both lungs are hypoaerated. Chronic retrocardiac opacity with small left pleural effusion reidentified. Cardiac leads overlie the chest. The visualized skeletal structures are unremarkable.  IMPRESSION: No significant change or new acute finding.   Electronically Signed   By: Conchita Paris M.D.   On: 02/13/2014 11:50    ASSESMENT:   * Painless hematochezia a/w hemodynamic instablility. Presumptive diverticular bleed. No plans for EGD or colonoscopy CT abdomen 11/8: fat stranding left. Extensive diverticular. Right lower quadrants. Aortic ectasia Bil fat containing inguinal hernias. Chronic left pleural effusion. Query esophageal dysmotility and GER? Stable calcification inferior right hepatic lobe   * ABL anemia. S/p one unit prior to transfer from [place], 3 more at [facility], last was on 11/9. Hgb stable.   * Chronic Plavix. For hx of CVA? On hold. S/p platelets x 3 to counteract. Consider never restarting.   * AFib , RVR:  New issue on admission but had been noted back in 2007. Refused Coumadin in past.  * ESRD. On hemodialysis MWF.    *  Chronic debilitation, wheelchair dependent at home.    PLAN   *  Please address pt's code status.  I had brief d/w pt and he does not want aggressive resuscitative efforts.  If he remains off plavix, risk of potential morbid complications (CVA...) will rise and best if we change code status to reflect his wishes.  *  Restart solid renal diet.    Derek Vincent  02/15/2014, 10:12 AM Pager: [phone]  ________________________________________________________________________  Velora Heckler GI MD note:  I personally examined the patient, reviewed the data and agree with the assessment and plan described above.  Bleeding appears to have stopped.  Still presume this was diverticular with plavix as accelerant.  Should consider not restarting the plavix.   Would give him another 3-4 days off the plavix if it is necessary to restart it.  Please call or page with any further questions or concerns.   Owens Loffler, MD  [facility] Pager [phone]

## 2014-03-17 ENCOUNTER — Encounter (HOSPITAL_COMMUNITY): Payer: Self-pay | Admitting: Vascular Surgery

## 2014-04-08 DIAGNOSIS — N2581 Secondary hyperparathyroidism of renal origin: Secondary | ICD-10-CM | POA: Diagnosis not present

## 2014-04-08 DIAGNOSIS — D509 Iron deficiency anemia, unspecified: Secondary | ICD-10-CM | POA: Diagnosis not present

## 2014-04-08 DIAGNOSIS — D631 Anemia in chronic kidney disease: Secondary | ICD-10-CM | POA: Diagnosis not present

## 2014-04-08 DIAGNOSIS — N186 End stage renal disease: Secondary | ICD-10-CM | POA: Diagnosis not present

## 2014-04-08 DIAGNOSIS — Z992 Dependence on renal dialysis: Secondary | ICD-10-CM | POA: Diagnosis not present

## 2014-04-08 DIAGNOSIS — R32 Unspecified urinary incontinence: Secondary | ICD-10-CM | POA: Diagnosis not present

## 2014-04-11 DIAGNOSIS — N2581 Secondary hyperparathyroidism of renal origin: Secondary | ICD-10-CM | POA: Diagnosis not present

## 2014-04-11 DIAGNOSIS — D509 Iron deficiency anemia, unspecified: Secondary | ICD-10-CM | POA: Diagnosis not present

## 2014-04-11 DIAGNOSIS — Z992 Dependence on renal dialysis: Secondary | ICD-10-CM | POA: Diagnosis not present

## 2014-04-11 DIAGNOSIS — N186 End stage renal disease: Secondary | ICD-10-CM | POA: Diagnosis not present

## 2014-04-11 DIAGNOSIS — D631 Anemia in chronic kidney disease: Secondary | ICD-10-CM | POA: Diagnosis not present

## 2014-04-13 DIAGNOSIS — D631 Anemia in chronic kidney disease: Secondary | ICD-10-CM | POA: Diagnosis not present

## 2014-04-13 DIAGNOSIS — N186 End stage renal disease: Secondary | ICD-10-CM | POA: Diagnosis not present

## 2014-04-13 DIAGNOSIS — Z992 Dependence on renal dialysis: Secondary | ICD-10-CM | POA: Diagnosis not present

## 2014-04-13 DIAGNOSIS — D509 Iron deficiency anemia, unspecified: Secondary | ICD-10-CM | POA: Diagnosis not present

## 2014-04-13 DIAGNOSIS — N2581 Secondary hyperparathyroidism of renal origin: Secondary | ICD-10-CM | POA: Diagnosis not present

## 2014-04-14 DIAGNOSIS — I259 Chronic ischemic heart disease, unspecified: Secondary | ICD-10-CM | POA: Diagnosis not present

## 2014-04-14 DIAGNOSIS — E780 Pure hypercholesterolemia: Secondary | ICD-10-CM | POA: Diagnosis not present

## 2014-04-14 DIAGNOSIS — I10 Essential (primary) hypertension: Secondary | ICD-10-CM | POA: Diagnosis not present

## 2014-04-14 DIAGNOSIS — I482 Chronic atrial fibrillation: Secondary | ICD-10-CM | POA: Diagnosis not present

## 2014-04-14 DIAGNOSIS — D631 Anemia in chronic kidney disease: Secondary | ICD-10-CM | POA: Diagnosis not present

## 2014-04-15 DIAGNOSIS — Z992 Dependence on renal dialysis: Secondary | ICD-10-CM | POA: Diagnosis not present

## 2014-04-15 DIAGNOSIS — D631 Anemia in chronic kidney disease: Secondary | ICD-10-CM | POA: Diagnosis not present

## 2014-04-15 DIAGNOSIS — D509 Iron deficiency anemia, unspecified: Secondary | ICD-10-CM | POA: Diagnosis not present

## 2014-04-15 DIAGNOSIS — N186 End stage renal disease: Secondary | ICD-10-CM | POA: Diagnosis not present

## 2014-04-15 DIAGNOSIS — N2581 Secondary hyperparathyroidism of renal origin: Secondary | ICD-10-CM | POA: Diagnosis not present

## 2014-04-18 DIAGNOSIS — N186 End stage renal disease: Secondary | ICD-10-CM | POA: Diagnosis not present

## 2014-04-18 DIAGNOSIS — N2581 Secondary hyperparathyroidism of renal origin: Secondary | ICD-10-CM | POA: Diagnosis not present

## 2014-04-18 DIAGNOSIS — D631 Anemia in chronic kidney disease: Secondary | ICD-10-CM | POA: Diagnosis not present

## 2014-04-18 DIAGNOSIS — Z992 Dependence on renal dialysis: Secondary | ICD-10-CM | POA: Diagnosis not present

## 2014-04-18 DIAGNOSIS — D509 Iron deficiency anemia, unspecified: Secondary | ICD-10-CM | POA: Diagnosis not present

## 2014-04-20 DIAGNOSIS — D631 Anemia in chronic kidney disease: Secondary | ICD-10-CM | POA: Diagnosis not present

## 2014-04-20 DIAGNOSIS — Z992 Dependence on renal dialysis: Secondary | ICD-10-CM | POA: Diagnosis not present

## 2014-04-20 DIAGNOSIS — N186 End stage renal disease: Secondary | ICD-10-CM | POA: Diagnosis not present

## 2014-04-20 DIAGNOSIS — N2581 Secondary hyperparathyroidism of renal origin: Secondary | ICD-10-CM | POA: Diagnosis not present

## 2014-04-20 DIAGNOSIS — D509 Iron deficiency anemia, unspecified: Secondary | ICD-10-CM | POA: Diagnosis not present

## 2014-04-22 DIAGNOSIS — D631 Anemia in chronic kidney disease: Secondary | ICD-10-CM | POA: Diagnosis not present

## 2014-04-22 DIAGNOSIS — N186 End stage renal disease: Secondary | ICD-10-CM | POA: Diagnosis not present

## 2014-04-22 DIAGNOSIS — Z992 Dependence on renal dialysis: Secondary | ICD-10-CM | POA: Diagnosis not present

## 2014-04-22 DIAGNOSIS — N2581 Secondary hyperparathyroidism of renal origin: Secondary | ICD-10-CM | POA: Diagnosis not present

## 2014-04-22 DIAGNOSIS — D509 Iron deficiency anemia, unspecified: Secondary | ICD-10-CM | POA: Diagnosis not present

## 2014-04-25 DIAGNOSIS — D509 Iron deficiency anemia, unspecified: Secondary | ICD-10-CM | POA: Diagnosis not present

## 2014-04-25 DIAGNOSIS — Z992 Dependence on renal dialysis: Secondary | ICD-10-CM | POA: Diagnosis not present

## 2014-04-25 DIAGNOSIS — D631 Anemia in chronic kidney disease: Secondary | ICD-10-CM | POA: Diagnosis not present

## 2014-04-25 DIAGNOSIS — N186 End stage renal disease: Secondary | ICD-10-CM | POA: Diagnosis not present

## 2014-04-25 DIAGNOSIS — N2581 Secondary hyperparathyroidism of renal origin: Secondary | ICD-10-CM | POA: Diagnosis not present

## 2014-04-26 DIAGNOSIS — G5602 Carpal tunnel syndrome, left upper limb: Secondary | ICD-10-CM | POA: Diagnosis not present

## 2014-04-26 DIAGNOSIS — G5601 Carpal tunnel syndrome, right upper limb: Secondary | ICD-10-CM | POA: Diagnosis not present

## 2014-04-27 DIAGNOSIS — Z992 Dependence on renal dialysis: Secondary | ICD-10-CM | POA: Diagnosis not present

## 2014-04-27 DIAGNOSIS — D509 Iron deficiency anemia, unspecified: Secondary | ICD-10-CM | POA: Diagnosis not present

## 2014-04-27 DIAGNOSIS — N186 End stage renal disease: Secondary | ICD-10-CM | POA: Diagnosis not present

## 2014-04-27 DIAGNOSIS — D631 Anemia in chronic kidney disease: Secondary | ICD-10-CM | POA: Diagnosis not present

## 2014-04-27 DIAGNOSIS — N2581 Secondary hyperparathyroidism of renal origin: Secondary | ICD-10-CM | POA: Diagnosis not present

## 2014-04-29 DIAGNOSIS — D631 Anemia in chronic kidney disease: Secondary | ICD-10-CM | POA: Diagnosis not present

## 2014-04-29 DIAGNOSIS — D509 Iron deficiency anemia, unspecified: Secondary | ICD-10-CM | POA: Diagnosis not present

## 2014-04-29 DIAGNOSIS — Z992 Dependence on renal dialysis: Secondary | ICD-10-CM | POA: Diagnosis not present

## 2014-04-29 DIAGNOSIS — N186 End stage renal disease: Secondary | ICD-10-CM | POA: Diagnosis not present

## 2014-04-29 DIAGNOSIS — N2581 Secondary hyperparathyroidism of renal origin: Secondary | ICD-10-CM | POA: Diagnosis not present

## 2014-05-02 DIAGNOSIS — N2581 Secondary hyperparathyroidism of renal origin: Secondary | ICD-10-CM | POA: Diagnosis not present

## 2014-05-02 DIAGNOSIS — N186 End stage renal disease: Secondary | ICD-10-CM | POA: Diagnosis not present

## 2014-05-02 DIAGNOSIS — D631 Anemia in chronic kidney disease: Secondary | ICD-10-CM | POA: Diagnosis not present

## 2014-05-02 DIAGNOSIS — Z992 Dependence on renal dialysis: Secondary | ICD-10-CM | POA: Diagnosis not present

## 2014-05-02 DIAGNOSIS — D509 Iron deficiency anemia, unspecified: Secondary | ICD-10-CM | POA: Diagnosis not present

## 2014-05-04 DIAGNOSIS — D509 Iron deficiency anemia, unspecified: Secondary | ICD-10-CM | POA: Diagnosis not present

## 2014-05-04 DIAGNOSIS — N2581 Secondary hyperparathyroidism of renal origin: Secondary | ICD-10-CM | POA: Diagnosis not present

## 2014-05-04 DIAGNOSIS — N186 End stage renal disease: Secondary | ICD-10-CM | POA: Diagnosis not present

## 2014-05-04 DIAGNOSIS — D631 Anemia in chronic kidney disease: Secondary | ICD-10-CM | POA: Diagnosis not present

## 2014-05-04 DIAGNOSIS — Z992 Dependence on renal dialysis: Secondary | ICD-10-CM | POA: Diagnosis not present

## 2014-05-06 DIAGNOSIS — N186 End stage renal disease: Secondary | ICD-10-CM | POA: Diagnosis not present

## 2014-05-06 DIAGNOSIS — D509 Iron deficiency anemia, unspecified: Secondary | ICD-10-CM | POA: Diagnosis not present

## 2014-05-06 DIAGNOSIS — Z992 Dependence on renal dialysis: Secondary | ICD-10-CM | POA: Diagnosis not present

## 2014-05-06 DIAGNOSIS — D631 Anemia in chronic kidney disease: Secondary | ICD-10-CM | POA: Diagnosis not present

## 2014-05-06 DIAGNOSIS — N2581 Secondary hyperparathyroidism of renal origin: Secondary | ICD-10-CM | POA: Diagnosis not present

## 2014-05-08 DIAGNOSIS — N186 End stage renal disease: Secondary | ICD-10-CM | POA: Diagnosis not present

## 2014-05-08 DIAGNOSIS — Z992 Dependence on renal dialysis: Secondary | ICD-10-CM | POA: Diagnosis not present

## 2014-05-09 DIAGNOSIS — D631 Anemia in chronic kidney disease: Secondary | ICD-10-CM | POA: Diagnosis not present

## 2014-05-09 DIAGNOSIS — T82898A Other specified complication of vascular prosthetic devices, implants and grafts, initial encounter: Secondary | ICD-10-CM | POA: Diagnosis not present

## 2014-05-09 DIAGNOSIS — N186 End stage renal disease: Secondary | ICD-10-CM | POA: Diagnosis not present

## 2014-05-09 DIAGNOSIS — Z992 Dependence on renal dialysis: Secondary | ICD-10-CM | POA: Diagnosis not present

## 2014-05-09 DIAGNOSIS — R32 Unspecified urinary incontinence: Secondary | ICD-10-CM | POA: Diagnosis not present

## 2014-05-09 DIAGNOSIS — D509 Iron deficiency anemia, unspecified: Secondary | ICD-10-CM | POA: Diagnosis not present

## 2014-05-11 DIAGNOSIS — D631 Anemia in chronic kidney disease: Secondary | ICD-10-CM | POA: Diagnosis not present

## 2014-05-11 DIAGNOSIS — Z992 Dependence on renal dialysis: Secondary | ICD-10-CM | POA: Diagnosis not present

## 2014-05-11 DIAGNOSIS — T82898A Other specified complication of vascular prosthetic devices, implants and grafts, initial encounter: Secondary | ICD-10-CM | POA: Diagnosis not present

## 2014-05-11 DIAGNOSIS — D509 Iron deficiency anemia, unspecified: Secondary | ICD-10-CM | POA: Diagnosis not present

## 2014-05-11 DIAGNOSIS — N186 End stage renal disease: Secondary | ICD-10-CM | POA: Diagnosis not present

## 2014-05-13 DIAGNOSIS — D509 Iron deficiency anemia, unspecified: Secondary | ICD-10-CM | POA: Diagnosis not present

## 2014-05-13 DIAGNOSIS — T82898A Other specified complication of vascular prosthetic devices, implants and grafts, initial encounter: Secondary | ICD-10-CM | POA: Diagnosis not present

## 2014-05-13 DIAGNOSIS — D631 Anemia in chronic kidney disease: Secondary | ICD-10-CM | POA: Diagnosis not present

## 2014-05-13 DIAGNOSIS — Z992 Dependence on renal dialysis: Secondary | ICD-10-CM | POA: Diagnosis not present

## 2014-05-13 DIAGNOSIS — N186 End stage renal disease: Secondary | ICD-10-CM | POA: Diagnosis not present

## 2014-05-16 DIAGNOSIS — D631 Anemia in chronic kidney disease: Secondary | ICD-10-CM | POA: Diagnosis not present

## 2014-05-16 DIAGNOSIS — T82898A Other specified complication of vascular prosthetic devices, implants and grafts, initial encounter: Secondary | ICD-10-CM | POA: Diagnosis not present

## 2014-05-16 DIAGNOSIS — N186 End stage renal disease: Secondary | ICD-10-CM | POA: Diagnosis not present

## 2014-05-16 DIAGNOSIS — Z992 Dependence on renal dialysis: Secondary | ICD-10-CM | POA: Diagnosis not present

## 2014-05-16 DIAGNOSIS — D509 Iron deficiency anemia, unspecified: Secondary | ICD-10-CM | POA: Diagnosis not present

## 2014-05-18 DIAGNOSIS — T82898A Other specified complication of vascular prosthetic devices, implants and grafts, initial encounter: Secondary | ICD-10-CM | POA: Diagnosis not present

## 2014-05-18 DIAGNOSIS — D631 Anemia in chronic kidney disease: Secondary | ICD-10-CM | POA: Diagnosis not present

## 2014-05-18 DIAGNOSIS — N186 End stage renal disease: Secondary | ICD-10-CM | POA: Diagnosis not present

## 2014-05-18 DIAGNOSIS — D509 Iron deficiency anemia, unspecified: Secondary | ICD-10-CM | POA: Diagnosis not present

## 2014-05-18 DIAGNOSIS — Z992 Dependence on renal dialysis: Secondary | ICD-10-CM | POA: Diagnosis not present

## 2014-05-20 DIAGNOSIS — D509 Iron deficiency anemia, unspecified: Secondary | ICD-10-CM | POA: Diagnosis not present

## 2014-05-20 DIAGNOSIS — Z992 Dependence on renal dialysis: Secondary | ICD-10-CM | POA: Diagnosis not present

## 2014-05-20 DIAGNOSIS — D631 Anemia in chronic kidney disease: Secondary | ICD-10-CM | POA: Diagnosis not present

## 2014-05-20 DIAGNOSIS — N186 End stage renal disease: Secondary | ICD-10-CM | POA: Diagnosis not present

## 2014-05-20 DIAGNOSIS — T82898A Other specified complication of vascular prosthetic devices, implants and grafts, initial encounter: Secondary | ICD-10-CM | POA: Diagnosis not present

## 2014-05-23 DIAGNOSIS — T82898A Other specified complication of vascular prosthetic devices, implants and grafts, initial encounter: Secondary | ICD-10-CM | POA: Diagnosis not present

## 2014-05-23 DIAGNOSIS — N186 End stage renal disease: Secondary | ICD-10-CM | POA: Diagnosis not present

## 2014-05-23 DIAGNOSIS — Z992 Dependence on renal dialysis: Secondary | ICD-10-CM | POA: Diagnosis not present

## 2014-05-23 DIAGNOSIS — D631 Anemia in chronic kidney disease: Secondary | ICD-10-CM | POA: Diagnosis not present

## 2014-05-23 DIAGNOSIS — D509 Iron deficiency anemia, unspecified: Secondary | ICD-10-CM | POA: Diagnosis not present

## 2014-05-25 DIAGNOSIS — D631 Anemia in chronic kidney disease: Secondary | ICD-10-CM | POA: Diagnosis not present

## 2014-05-25 DIAGNOSIS — D509 Iron deficiency anemia, unspecified: Secondary | ICD-10-CM | POA: Diagnosis not present

## 2014-05-25 DIAGNOSIS — Z992 Dependence on renal dialysis: Secondary | ICD-10-CM | POA: Diagnosis not present

## 2014-05-25 DIAGNOSIS — T82898A Other specified complication of vascular prosthetic devices, implants and grafts, initial encounter: Secondary | ICD-10-CM | POA: Diagnosis not present

## 2014-05-25 DIAGNOSIS — N186 End stage renal disease: Secondary | ICD-10-CM | POA: Diagnosis not present

## 2014-05-27 DIAGNOSIS — T82898A Other specified complication of vascular prosthetic devices, implants and grafts, initial encounter: Secondary | ICD-10-CM | POA: Diagnosis not present

## 2014-05-27 DIAGNOSIS — Z992 Dependence on renal dialysis: Secondary | ICD-10-CM | POA: Diagnosis not present

## 2014-05-27 DIAGNOSIS — D631 Anemia in chronic kidney disease: Secondary | ICD-10-CM | POA: Diagnosis not present

## 2014-05-27 DIAGNOSIS — D509 Iron deficiency anemia, unspecified: Secondary | ICD-10-CM | POA: Diagnosis not present

## 2014-05-27 DIAGNOSIS — N186 End stage renal disease: Secondary | ICD-10-CM | POA: Diagnosis not present

## 2014-05-30 DIAGNOSIS — N186 End stage renal disease: Secondary | ICD-10-CM | POA: Diagnosis not present

## 2014-05-30 DIAGNOSIS — D631 Anemia in chronic kidney disease: Secondary | ICD-10-CM | POA: Diagnosis not present

## 2014-05-30 DIAGNOSIS — Z992 Dependence on renal dialysis: Secondary | ICD-10-CM | POA: Diagnosis not present

## 2014-05-30 DIAGNOSIS — T82898A Other specified complication of vascular prosthetic devices, implants and grafts, initial encounter: Secondary | ICD-10-CM | POA: Diagnosis not present

## 2014-05-30 DIAGNOSIS — D509 Iron deficiency anemia, unspecified: Secondary | ICD-10-CM | POA: Diagnosis not present

## 2014-06-01 DIAGNOSIS — N186 End stage renal disease: Secondary | ICD-10-CM | POA: Diagnosis not present

## 2014-06-01 DIAGNOSIS — D631 Anemia in chronic kidney disease: Secondary | ICD-10-CM | POA: Diagnosis not present

## 2014-06-01 DIAGNOSIS — Z992 Dependence on renal dialysis: Secondary | ICD-10-CM | POA: Diagnosis not present

## 2014-06-01 DIAGNOSIS — T82898A Other specified complication of vascular prosthetic devices, implants and grafts, initial encounter: Secondary | ICD-10-CM | POA: Diagnosis not present

## 2014-06-01 DIAGNOSIS — D509 Iron deficiency anemia, unspecified: Secondary | ICD-10-CM | POA: Diagnosis not present

## 2014-06-03 DIAGNOSIS — D631 Anemia in chronic kidney disease: Secondary | ICD-10-CM | POA: Diagnosis not present

## 2014-06-03 DIAGNOSIS — D509 Iron deficiency anemia, unspecified: Secondary | ICD-10-CM | POA: Diagnosis not present

## 2014-06-03 DIAGNOSIS — Z992 Dependence on renal dialysis: Secondary | ICD-10-CM | POA: Diagnosis not present

## 2014-06-03 DIAGNOSIS — T82898A Other specified complication of vascular prosthetic devices, implants and grafts, initial encounter: Secondary | ICD-10-CM | POA: Diagnosis not present

## 2014-06-03 DIAGNOSIS — N186 End stage renal disease: Secondary | ICD-10-CM | POA: Diagnosis not present

## 2014-06-06 DIAGNOSIS — N186 End stage renal disease: Secondary | ICD-10-CM | POA: Diagnosis not present

## 2014-06-06 DIAGNOSIS — D509 Iron deficiency anemia, unspecified: Secondary | ICD-10-CM | POA: Diagnosis not present

## 2014-06-06 DIAGNOSIS — D631 Anemia in chronic kidney disease: Secondary | ICD-10-CM | POA: Diagnosis not present

## 2014-06-06 DIAGNOSIS — T82898A Other specified complication of vascular prosthetic devices, implants and grafts, initial encounter: Secondary | ICD-10-CM | POA: Diagnosis not present

## 2014-06-06 DIAGNOSIS — Z992 Dependence on renal dialysis: Secondary | ICD-10-CM | POA: Diagnosis not present

## 2014-06-07 DIAGNOSIS — R32 Unspecified urinary incontinence: Secondary | ICD-10-CM | POA: Diagnosis not present

## 2014-06-08 DIAGNOSIS — N186 End stage renal disease: Secondary | ICD-10-CM | POA: Diagnosis not present

## 2014-06-08 DIAGNOSIS — Z992 Dependence on renal dialysis: Secondary | ICD-10-CM | POA: Diagnosis not present

## 2014-06-08 DIAGNOSIS — D631 Anemia in chronic kidney disease: Secondary | ICD-10-CM | POA: Diagnosis not present

## 2014-06-08 DIAGNOSIS — D509 Iron deficiency anemia, unspecified: Secondary | ICD-10-CM | POA: Diagnosis not present

## 2014-06-10 DIAGNOSIS — D631 Anemia in chronic kidney disease: Secondary | ICD-10-CM | POA: Diagnosis not present

## 2014-06-10 DIAGNOSIS — N186 End stage renal disease: Secondary | ICD-10-CM | POA: Diagnosis not present

## 2014-06-10 DIAGNOSIS — Z992 Dependence on renal dialysis: Secondary | ICD-10-CM | POA: Diagnosis not present

## 2014-06-10 DIAGNOSIS — D509 Iron deficiency anemia, unspecified: Secondary | ICD-10-CM | POA: Diagnosis not present

## 2014-06-13 DIAGNOSIS — D631 Anemia in chronic kidney disease: Secondary | ICD-10-CM | POA: Diagnosis not present

## 2014-06-13 DIAGNOSIS — D509 Iron deficiency anemia, unspecified: Secondary | ICD-10-CM | POA: Diagnosis not present

## 2014-06-13 DIAGNOSIS — Z992 Dependence on renal dialysis: Secondary | ICD-10-CM | POA: Diagnosis not present

## 2014-06-13 DIAGNOSIS — N186 End stage renal disease: Secondary | ICD-10-CM | POA: Diagnosis not present

## 2014-06-15 DIAGNOSIS — N186 End stage renal disease: Secondary | ICD-10-CM | POA: Diagnosis not present

## 2014-06-15 DIAGNOSIS — D631 Anemia in chronic kidney disease: Secondary | ICD-10-CM | POA: Diagnosis not present

## 2014-06-15 DIAGNOSIS — Z992 Dependence on renal dialysis: Secondary | ICD-10-CM | POA: Diagnosis not present

## 2014-06-15 DIAGNOSIS — D509 Iron deficiency anemia, unspecified: Secondary | ICD-10-CM | POA: Diagnosis not present

## 2014-06-17 DIAGNOSIS — Z992 Dependence on renal dialysis: Secondary | ICD-10-CM | POA: Diagnosis not present

## 2014-06-17 DIAGNOSIS — N186 End stage renal disease: Secondary | ICD-10-CM | POA: Diagnosis not present

## 2014-06-17 DIAGNOSIS — D631 Anemia in chronic kidney disease: Secondary | ICD-10-CM | POA: Diagnosis not present

## 2014-06-17 DIAGNOSIS — D509 Iron deficiency anemia, unspecified: Secondary | ICD-10-CM | POA: Diagnosis not present

## 2014-06-20 DIAGNOSIS — Z992 Dependence on renal dialysis: Secondary | ICD-10-CM | POA: Diagnosis not present

## 2014-06-20 DIAGNOSIS — N186 End stage renal disease: Secondary | ICD-10-CM | POA: Diagnosis not present

## 2014-06-20 DIAGNOSIS — D509 Iron deficiency anemia, unspecified: Secondary | ICD-10-CM | POA: Diagnosis not present

## 2014-06-20 DIAGNOSIS — D631 Anemia in chronic kidney disease: Secondary | ICD-10-CM | POA: Diagnosis not present

## 2014-06-22 DIAGNOSIS — N186 End stage renal disease: Secondary | ICD-10-CM | POA: Diagnosis not present

## 2014-06-22 DIAGNOSIS — D631 Anemia in chronic kidney disease: Secondary | ICD-10-CM | POA: Diagnosis not present

## 2014-06-22 DIAGNOSIS — D509 Iron deficiency anemia, unspecified: Secondary | ICD-10-CM | POA: Diagnosis not present

## 2014-06-22 DIAGNOSIS — Z992 Dependence on renal dialysis: Secondary | ICD-10-CM | POA: Diagnosis not present

## 2014-06-24 DIAGNOSIS — N186 End stage renal disease: Secondary | ICD-10-CM | POA: Diagnosis not present

## 2014-06-24 DIAGNOSIS — D509 Iron deficiency anemia, unspecified: Secondary | ICD-10-CM | POA: Diagnosis not present

## 2014-06-24 DIAGNOSIS — Z992 Dependence on renal dialysis: Secondary | ICD-10-CM | POA: Diagnosis not present

## 2014-06-24 DIAGNOSIS — D631 Anemia in chronic kidney disease: Secondary | ICD-10-CM | POA: Diagnosis not present

## 2014-06-27 DIAGNOSIS — D509 Iron deficiency anemia, unspecified: Secondary | ICD-10-CM | POA: Diagnosis not present

## 2014-06-27 DIAGNOSIS — D631 Anemia in chronic kidney disease: Secondary | ICD-10-CM | POA: Diagnosis not present

## 2014-06-27 DIAGNOSIS — Z992 Dependence on renal dialysis: Secondary | ICD-10-CM | POA: Diagnosis not present

## 2014-06-27 DIAGNOSIS — N186 End stage renal disease: Secondary | ICD-10-CM | POA: Diagnosis not present

## 2014-06-29 DIAGNOSIS — D631 Anemia in chronic kidney disease: Secondary | ICD-10-CM | POA: Diagnosis not present

## 2014-06-29 DIAGNOSIS — N186 End stage renal disease: Secondary | ICD-10-CM | POA: Diagnosis not present

## 2014-06-29 DIAGNOSIS — Z992 Dependence on renal dialysis: Secondary | ICD-10-CM | POA: Diagnosis not present

## 2014-06-29 DIAGNOSIS — D509 Iron deficiency anemia, unspecified: Secondary | ICD-10-CM | POA: Diagnosis not present

## 2014-07-01 DIAGNOSIS — Z992 Dependence on renal dialysis: Secondary | ICD-10-CM | POA: Diagnosis not present

## 2014-07-01 DIAGNOSIS — D509 Iron deficiency anemia, unspecified: Secondary | ICD-10-CM | POA: Diagnosis not present

## 2014-07-01 DIAGNOSIS — N186 End stage renal disease: Secondary | ICD-10-CM | POA: Diagnosis not present

## 2014-07-01 DIAGNOSIS — D631 Anemia in chronic kidney disease: Secondary | ICD-10-CM | POA: Diagnosis not present

## 2014-07-04 DIAGNOSIS — E785 Hyperlipidemia, unspecified: Secondary | ICD-10-CM | POA: Diagnosis not present

## 2014-07-04 DIAGNOSIS — D631 Anemia in chronic kidney disease: Secondary | ICD-10-CM | POA: Diagnosis not present

## 2014-07-04 DIAGNOSIS — N186 End stage renal disease: Secondary | ICD-10-CM | POA: Diagnosis not present

## 2014-07-04 DIAGNOSIS — D509 Iron deficiency anemia, unspecified: Secondary | ICD-10-CM | POA: Diagnosis not present

## 2014-07-04 DIAGNOSIS — Z992 Dependence on renal dialysis: Secondary | ICD-10-CM | POA: Diagnosis not present

## 2014-07-06 DIAGNOSIS — N186 End stage renal disease: Secondary | ICD-10-CM | POA: Diagnosis not present

## 2014-07-06 DIAGNOSIS — D631 Anemia in chronic kidney disease: Secondary | ICD-10-CM | POA: Diagnosis not present

## 2014-07-06 DIAGNOSIS — Z992 Dependence on renal dialysis: Secondary | ICD-10-CM | POA: Diagnosis not present

## 2014-07-06 DIAGNOSIS — D509 Iron deficiency anemia, unspecified: Secondary | ICD-10-CM | POA: Diagnosis not present

## 2014-07-07 DIAGNOSIS — Z992 Dependence on renal dialysis: Secondary | ICD-10-CM | POA: Diagnosis not present

## 2014-07-07 DIAGNOSIS — N186 End stage renal disease: Secondary | ICD-10-CM | POA: Diagnosis not present

## 2014-07-08 DIAGNOSIS — Z992 Dependence on renal dialysis: Secondary | ICD-10-CM | POA: Diagnosis not present

## 2014-07-08 DIAGNOSIS — D509 Iron deficiency anemia, unspecified: Secondary | ICD-10-CM | POA: Diagnosis not present

## 2014-07-08 DIAGNOSIS — D631 Anemia in chronic kidney disease: Secondary | ICD-10-CM | POA: Diagnosis not present

## 2014-07-08 DIAGNOSIS — R32 Unspecified urinary incontinence: Secondary | ICD-10-CM | POA: Diagnosis not present

## 2014-07-08 DIAGNOSIS — N186 End stage renal disease: Secondary | ICD-10-CM | POA: Diagnosis not present

## 2014-07-08 DIAGNOSIS — T82898A Other specified complication of vascular prosthetic devices, implants and grafts, initial encounter: Secondary | ICD-10-CM | POA: Diagnosis not present

## 2014-07-11 DIAGNOSIS — N186 End stage renal disease: Secondary | ICD-10-CM | POA: Diagnosis not present

## 2014-07-11 DIAGNOSIS — Z992 Dependence on renal dialysis: Secondary | ICD-10-CM | POA: Diagnosis not present

## 2014-07-11 DIAGNOSIS — T82898A Other specified complication of vascular prosthetic devices, implants and grafts, initial encounter: Secondary | ICD-10-CM | POA: Diagnosis not present

## 2014-07-11 DIAGNOSIS — D631 Anemia in chronic kidney disease: Secondary | ICD-10-CM | POA: Diagnosis not present

## 2014-07-11 DIAGNOSIS — D509 Iron deficiency anemia, unspecified: Secondary | ICD-10-CM | POA: Diagnosis not present

## 2014-07-13 DIAGNOSIS — D509 Iron deficiency anemia, unspecified: Secondary | ICD-10-CM | POA: Diagnosis not present

## 2014-07-13 DIAGNOSIS — N186 End stage renal disease: Secondary | ICD-10-CM | POA: Diagnosis not present

## 2014-07-13 DIAGNOSIS — Z992 Dependence on renal dialysis: Secondary | ICD-10-CM | POA: Diagnosis not present

## 2014-07-13 DIAGNOSIS — D631 Anemia in chronic kidney disease: Secondary | ICD-10-CM | POA: Diagnosis not present

## 2014-07-13 DIAGNOSIS — T82898A Other specified complication of vascular prosthetic devices, implants and grafts, initial encounter: Secondary | ICD-10-CM | POA: Diagnosis not present

## 2014-07-15 DIAGNOSIS — D631 Anemia in chronic kidney disease: Secondary | ICD-10-CM | POA: Diagnosis not present

## 2014-07-15 DIAGNOSIS — D509 Iron deficiency anemia, unspecified: Secondary | ICD-10-CM | POA: Diagnosis not present

## 2014-07-15 DIAGNOSIS — N186 End stage renal disease: Secondary | ICD-10-CM | POA: Diagnosis not present

## 2014-07-15 DIAGNOSIS — Z992 Dependence on renal dialysis: Secondary | ICD-10-CM | POA: Diagnosis not present

## 2014-07-15 DIAGNOSIS — T82898A Other specified complication of vascular prosthetic devices, implants and grafts, initial encounter: Secondary | ICD-10-CM | POA: Diagnosis not present

## 2014-07-18 DIAGNOSIS — Z992 Dependence on renal dialysis: Secondary | ICD-10-CM | POA: Diagnosis not present

## 2014-07-18 DIAGNOSIS — T82898A Other specified complication of vascular prosthetic devices, implants and grafts, initial encounter: Secondary | ICD-10-CM | POA: Diagnosis not present

## 2014-07-18 DIAGNOSIS — D509 Iron deficiency anemia, unspecified: Secondary | ICD-10-CM | POA: Diagnosis not present

## 2014-07-18 DIAGNOSIS — N186 End stage renal disease: Secondary | ICD-10-CM | POA: Diagnosis not present

## 2014-07-18 DIAGNOSIS — D631 Anemia in chronic kidney disease: Secondary | ICD-10-CM | POA: Diagnosis not present

## 2014-07-20 DIAGNOSIS — T82898A Other specified complication of vascular prosthetic devices, implants and grafts, initial encounter: Secondary | ICD-10-CM | POA: Diagnosis not present

## 2014-07-20 DIAGNOSIS — D631 Anemia in chronic kidney disease: Secondary | ICD-10-CM | POA: Diagnosis not present

## 2014-07-20 DIAGNOSIS — Z992 Dependence on renal dialysis: Secondary | ICD-10-CM | POA: Diagnosis not present

## 2014-07-20 DIAGNOSIS — D509 Iron deficiency anemia, unspecified: Secondary | ICD-10-CM | POA: Diagnosis not present

## 2014-07-20 DIAGNOSIS — N186 End stage renal disease: Secondary | ICD-10-CM | POA: Diagnosis not present

## 2014-07-22 DIAGNOSIS — N186 End stage renal disease: Secondary | ICD-10-CM | POA: Diagnosis not present

## 2014-07-22 DIAGNOSIS — D631 Anemia in chronic kidney disease: Secondary | ICD-10-CM | POA: Diagnosis not present

## 2014-07-22 DIAGNOSIS — T82898A Other specified complication of vascular prosthetic devices, implants and grafts, initial encounter: Secondary | ICD-10-CM | POA: Diagnosis not present

## 2014-07-22 DIAGNOSIS — Z992 Dependence on renal dialysis: Secondary | ICD-10-CM | POA: Diagnosis not present

## 2014-07-22 DIAGNOSIS — D509 Iron deficiency anemia, unspecified: Secondary | ICD-10-CM | POA: Diagnosis not present

## 2014-07-25 DIAGNOSIS — N186 End stage renal disease: Secondary | ICD-10-CM | POA: Diagnosis not present

## 2014-07-25 DIAGNOSIS — D631 Anemia in chronic kidney disease: Secondary | ICD-10-CM | POA: Diagnosis not present

## 2014-07-25 DIAGNOSIS — D509 Iron deficiency anemia, unspecified: Secondary | ICD-10-CM | POA: Diagnosis not present

## 2014-07-25 DIAGNOSIS — T82898A Other specified complication of vascular prosthetic devices, implants and grafts, initial encounter: Secondary | ICD-10-CM | POA: Diagnosis not present

## 2014-07-25 DIAGNOSIS — Z992 Dependence on renal dialysis: Secondary | ICD-10-CM | POA: Diagnosis not present

## 2014-07-27 DIAGNOSIS — Z992 Dependence on renal dialysis: Secondary | ICD-10-CM | POA: Diagnosis not present

## 2014-07-27 DIAGNOSIS — D631 Anemia in chronic kidney disease: Secondary | ICD-10-CM | POA: Diagnosis not present

## 2014-07-27 DIAGNOSIS — D509 Iron deficiency anemia, unspecified: Secondary | ICD-10-CM | POA: Diagnosis not present

## 2014-07-27 DIAGNOSIS — T82898A Other specified complication of vascular prosthetic devices, implants and grafts, initial encounter: Secondary | ICD-10-CM | POA: Diagnosis not present

## 2014-07-27 DIAGNOSIS — N186 End stage renal disease: Secondary | ICD-10-CM | POA: Diagnosis not present

## 2014-07-29 DIAGNOSIS — D631 Anemia in chronic kidney disease: Secondary | ICD-10-CM | POA: Diagnosis not present

## 2014-07-29 DIAGNOSIS — Z992 Dependence on renal dialysis: Secondary | ICD-10-CM | POA: Diagnosis not present

## 2014-07-29 DIAGNOSIS — T82898A Other specified complication of vascular prosthetic devices, implants and grafts, initial encounter: Secondary | ICD-10-CM | POA: Diagnosis not present

## 2014-07-29 DIAGNOSIS — D509 Iron deficiency anemia, unspecified: Secondary | ICD-10-CM | POA: Diagnosis not present

## 2014-07-29 DIAGNOSIS — N186 End stage renal disease: Secondary | ICD-10-CM | POA: Diagnosis not present

## 2014-08-01 DIAGNOSIS — D631 Anemia in chronic kidney disease: Secondary | ICD-10-CM | POA: Diagnosis not present

## 2014-08-01 DIAGNOSIS — N186 End stage renal disease: Secondary | ICD-10-CM | POA: Diagnosis not present

## 2014-08-01 DIAGNOSIS — D509 Iron deficiency anemia, unspecified: Secondary | ICD-10-CM | POA: Diagnosis not present

## 2014-08-01 DIAGNOSIS — T82898A Other specified complication of vascular prosthetic devices, implants and grafts, initial encounter: Secondary | ICD-10-CM | POA: Diagnosis not present

## 2014-08-01 DIAGNOSIS — Z992 Dependence on renal dialysis: Secondary | ICD-10-CM | POA: Diagnosis not present

## 2014-08-03 DIAGNOSIS — N186 End stage renal disease: Secondary | ICD-10-CM | POA: Diagnosis not present

## 2014-08-03 DIAGNOSIS — Z992 Dependence on renal dialysis: Secondary | ICD-10-CM | POA: Diagnosis not present

## 2014-08-03 DIAGNOSIS — T82898A Other specified complication of vascular prosthetic devices, implants and grafts, initial encounter: Secondary | ICD-10-CM | POA: Diagnosis not present

## 2014-08-03 DIAGNOSIS — D509 Iron deficiency anemia, unspecified: Secondary | ICD-10-CM | POA: Diagnosis not present

## 2014-08-03 DIAGNOSIS — D631 Anemia in chronic kidney disease: Secondary | ICD-10-CM | POA: Diagnosis not present

## 2014-08-04 DIAGNOSIS — D500 Iron deficiency anemia secondary to blood loss (chronic): Secondary | ICD-10-CM | POA: Diagnosis not present

## 2014-08-04 DIAGNOSIS — R7302 Impaired glucose tolerance (oral): Secondary | ICD-10-CM | POA: Diagnosis not present

## 2014-08-04 DIAGNOSIS — D631 Anemia in chronic kidney disease: Secondary | ICD-10-CM | POA: Diagnosis not present

## 2014-08-04 DIAGNOSIS — E780 Pure hypercholesterolemia: Secondary | ICD-10-CM | POA: Diagnosis not present

## 2014-08-04 DIAGNOSIS — I10 Essential (primary) hypertension: Secondary | ICD-10-CM | POA: Diagnosis not present

## 2014-08-04 DIAGNOSIS — I482 Chronic atrial fibrillation: Secondary | ICD-10-CM | POA: Diagnosis not present

## 2014-08-05 DIAGNOSIS — D509 Iron deficiency anemia, unspecified: Secondary | ICD-10-CM | POA: Diagnosis not present

## 2014-08-05 DIAGNOSIS — N186 End stage renal disease: Secondary | ICD-10-CM | POA: Diagnosis not present

## 2014-08-05 DIAGNOSIS — Z992 Dependence on renal dialysis: Secondary | ICD-10-CM | POA: Diagnosis not present

## 2014-08-05 DIAGNOSIS — T82898A Other specified complication of vascular prosthetic devices, implants and grafts, initial encounter: Secondary | ICD-10-CM | POA: Diagnosis not present

## 2014-08-05 DIAGNOSIS — D631 Anemia in chronic kidney disease: Secondary | ICD-10-CM | POA: Diagnosis not present

## 2014-08-06 DIAGNOSIS — Z992 Dependence on renal dialysis: Secondary | ICD-10-CM | POA: Diagnosis not present

## 2014-08-06 DIAGNOSIS — N186 End stage renal disease: Secondary | ICD-10-CM | POA: Diagnosis not present

## 2014-08-08 DIAGNOSIS — Z992 Dependence on renal dialysis: Secondary | ICD-10-CM | POA: Diagnosis not present

## 2014-08-08 DIAGNOSIS — E8770 Fluid overload, unspecified: Secondary | ICD-10-CM | POA: Diagnosis not present

## 2014-08-08 DIAGNOSIS — D509 Iron deficiency anemia, unspecified: Secondary | ICD-10-CM | POA: Diagnosis not present

## 2014-08-08 DIAGNOSIS — N186 End stage renal disease: Secondary | ICD-10-CM | POA: Diagnosis not present

## 2014-08-08 DIAGNOSIS — D631 Anemia in chronic kidney disease: Secondary | ICD-10-CM | POA: Diagnosis not present

## 2014-08-09 DIAGNOSIS — I10 Essential (primary) hypertension: Secondary | ICD-10-CM | POA: Diagnosis not present

## 2014-08-09 DIAGNOSIS — I259 Chronic ischemic heart disease, unspecified: Secondary | ICD-10-CM | POA: Diagnosis not present

## 2014-08-09 DIAGNOSIS — I482 Chronic atrial fibrillation: Secondary | ICD-10-CM | POA: Diagnosis not present

## 2014-08-09 DIAGNOSIS — J439 Emphysema, unspecified: Secondary | ICD-10-CM | POA: Diagnosis not present

## 2014-08-09 DIAGNOSIS — N185 Chronic kidney disease, stage 5: Secondary | ICD-10-CM | POA: Diagnosis not present

## 2014-08-10 DIAGNOSIS — E8770 Fluid overload, unspecified: Secondary | ICD-10-CM | POA: Diagnosis not present

## 2014-08-10 DIAGNOSIS — Z992 Dependence on renal dialysis: Secondary | ICD-10-CM | POA: Diagnosis not present

## 2014-08-10 DIAGNOSIS — N186 End stage renal disease: Secondary | ICD-10-CM | POA: Diagnosis not present

## 2014-08-10 DIAGNOSIS — D509 Iron deficiency anemia, unspecified: Secondary | ICD-10-CM | POA: Diagnosis not present

## 2014-08-10 DIAGNOSIS — D631 Anemia in chronic kidney disease: Secondary | ICD-10-CM | POA: Diagnosis not present

## 2014-08-12 DIAGNOSIS — D509 Iron deficiency anemia, unspecified: Secondary | ICD-10-CM | POA: Diagnosis not present

## 2014-08-12 DIAGNOSIS — D631 Anemia in chronic kidney disease: Secondary | ICD-10-CM | POA: Diagnosis not present

## 2014-08-12 DIAGNOSIS — N186 End stage renal disease: Secondary | ICD-10-CM | POA: Diagnosis not present

## 2014-08-12 DIAGNOSIS — Z992 Dependence on renal dialysis: Secondary | ICD-10-CM | POA: Diagnosis not present

## 2014-08-12 DIAGNOSIS — E8770 Fluid overload, unspecified: Secondary | ICD-10-CM | POA: Diagnosis not present

## 2014-08-15 DIAGNOSIS — D631 Anemia in chronic kidney disease: Secondary | ICD-10-CM | POA: Diagnosis not present

## 2014-08-15 DIAGNOSIS — Z992 Dependence on renal dialysis: Secondary | ICD-10-CM | POA: Diagnosis not present

## 2014-08-15 DIAGNOSIS — N186 End stage renal disease: Secondary | ICD-10-CM | POA: Diagnosis not present

## 2014-08-15 DIAGNOSIS — D509 Iron deficiency anemia, unspecified: Secondary | ICD-10-CM | POA: Diagnosis not present

## 2014-08-15 DIAGNOSIS — E8770 Fluid overload, unspecified: Secondary | ICD-10-CM | POA: Diagnosis not present

## 2014-08-17 DIAGNOSIS — D509 Iron deficiency anemia, unspecified: Secondary | ICD-10-CM | POA: Diagnosis not present

## 2014-08-17 DIAGNOSIS — Z992 Dependence on renal dialysis: Secondary | ICD-10-CM | POA: Diagnosis not present

## 2014-08-17 DIAGNOSIS — E8770 Fluid overload, unspecified: Secondary | ICD-10-CM | POA: Diagnosis not present

## 2014-08-17 DIAGNOSIS — N186 End stage renal disease: Secondary | ICD-10-CM | POA: Diagnosis not present

## 2014-08-17 DIAGNOSIS — D631 Anemia in chronic kidney disease: Secondary | ICD-10-CM | POA: Diagnosis not present

## 2014-08-18 DIAGNOSIS — E8770 Fluid overload, unspecified: Secondary | ICD-10-CM | POA: Diagnosis not present

## 2014-08-18 DIAGNOSIS — N186 End stage renal disease: Secondary | ICD-10-CM | POA: Diagnosis not present

## 2014-08-18 DIAGNOSIS — D509 Iron deficiency anemia, unspecified: Secondary | ICD-10-CM | POA: Diagnosis not present

## 2014-08-18 DIAGNOSIS — Z992 Dependence on renal dialysis: Secondary | ICD-10-CM | POA: Diagnosis not present

## 2014-08-18 DIAGNOSIS — D631 Anemia in chronic kidney disease: Secondary | ICD-10-CM | POA: Diagnosis not present

## 2014-08-19 DIAGNOSIS — N186 End stage renal disease: Secondary | ICD-10-CM | POA: Diagnosis not present

## 2014-08-19 DIAGNOSIS — D509 Iron deficiency anemia, unspecified: Secondary | ICD-10-CM | POA: Diagnosis not present

## 2014-08-19 DIAGNOSIS — D631 Anemia in chronic kidney disease: Secondary | ICD-10-CM | POA: Diagnosis not present

## 2014-08-19 DIAGNOSIS — Z992 Dependence on renal dialysis: Secondary | ICD-10-CM | POA: Diagnosis not present

## 2014-08-19 DIAGNOSIS — E8770 Fluid overload, unspecified: Secondary | ICD-10-CM | POA: Diagnosis not present

## 2014-08-22 DIAGNOSIS — D631 Anemia in chronic kidney disease: Secondary | ICD-10-CM | POA: Diagnosis not present

## 2014-08-22 DIAGNOSIS — N186 End stage renal disease: Secondary | ICD-10-CM | POA: Diagnosis not present

## 2014-08-22 DIAGNOSIS — D509 Iron deficiency anemia, unspecified: Secondary | ICD-10-CM | POA: Diagnosis not present

## 2014-08-22 DIAGNOSIS — Z992 Dependence on renal dialysis: Secondary | ICD-10-CM | POA: Diagnosis not present

## 2014-08-22 DIAGNOSIS — E8770 Fluid overload, unspecified: Secondary | ICD-10-CM | POA: Diagnosis not present

## 2014-08-24 DIAGNOSIS — D509 Iron deficiency anemia, unspecified: Secondary | ICD-10-CM | POA: Diagnosis not present

## 2014-08-24 DIAGNOSIS — N186 End stage renal disease: Secondary | ICD-10-CM | POA: Diagnosis not present

## 2014-08-24 DIAGNOSIS — E8770 Fluid overload, unspecified: Secondary | ICD-10-CM | POA: Diagnosis not present

## 2014-08-24 DIAGNOSIS — D631 Anemia in chronic kidney disease: Secondary | ICD-10-CM | POA: Diagnosis not present

## 2014-08-24 DIAGNOSIS — Z992 Dependence on renal dialysis: Secondary | ICD-10-CM | POA: Diagnosis not present

## 2014-08-26 DIAGNOSIS — Z992 Dependence on renal dialysis: Secondary | ICD-10-CM | POA: Diagnosis not present

## 2014-08-26 DIAGNOSIS — D509 Iron deficiency anemia, unspecified: Secondary | ICD-10-CM | POA: Diagnosis not present

## 2014-08-26 DIAGNOSIS — D631 Anemia in chronic kidney disease: Secondary | ICD-10-CM | POA: Diagnosis not present

## 2014-08-26 DIAGNOSIS — E8770 Fluid overload, unspecified: Secondary | ICD-10-CM | POA: Diagnosis not present

## 2014-08-26 DIAGNOSIS — N186 End stage renal disease: Secondary | ICD-10-CM | POA: Diagnosis not present

## 2014-08-29 DIAGNOSIS — Z992 Dependence on renal dialysis: Secondary | ICD-10-CM | POA: Diagnosis not present

## 2014-08-29 DIAGNOSIS — D509 Iron deficiency anemia, unspecified: Secondary | ICD-10-CM | POA: Diagnosis not present

## 2014-08-29 DIAGNOSIS — N186 End stage renal disease: Secondary | ICD-10-CM | POA: Diagnosis not present

## 2014-08-29 DIAGNOSIS — D631 Anemia in chronic kidney disease: Secondary | ICD-10-CM | POA: Diagnosis not present

## 2014-08-29 DIAGNOSIS — E8770 Fluid overload, unspecified: Secondary | ICD-10-CM | POA: Diagnosis not present

## 2014-08-31 DIAGNOSIS — Z992 Dependence on renal dialysis: Secondary | ICD-10-CM | POA: Diagnosis not present

## 2014-08-31 DIAGNOSIS — D631 Anemia in chronic kidney disease: Secondary | ICD-10-CM | POA: Diagnosis not present

## 2014-08-31 DIAGNOSIS — D509 Iron deficiency anemia, unspecified: Secondary | ICD-10-CM | POA: Diagnosis not present

## 2014-08-31 DIAGNOSIS — N186 End stage renal disease: Secondary | ICD-10-CM | POA: Diagnosis not present

## 2014-08-31 DIAGNOSIS — E8770 Fluid overload, unspecified: Secondary | ICD-10-CM | POA: Diagnosis not present

## 2014-09-02 DIAGNOSIS — D631 Anemia in chronic kidney disease: Secondary | ICD-10-CM | POA: Diagnosis not present

## 2014-09-02 DIAGNOSIS — E8770 Fluid overload, unspecified: Secondary | ICD-10-CM | POA: Diagnosis not present

## 2014-09-02 DIAGNOSIS — D509 Iron deficiency anemia, unspecified: Secondary | ICD-10-CM | POA: Diagnosis not present

## 2014-09-02 DIAGNOSIS — N186 End stage renal disease: Secondary | ICD-10-CM | POA: Diagnosis not present

## 2014-09-02 DIAGNOSIS — Z992 Dependence on renal dialysis: Secondary | ICD-10-CM | POA: Diagnosis not present

## 2014-09-05 DIAGNOSIS — Z992 Dependence on renal dialysis: Secondary | ICD-10-CM | POA: Diagnosis not present

## 2014-09-05 DIAGNOSIS — E8770 Fluid overload, unspecified: Secondary | ICD-10-CM | POA: Diagnosis not present

## 2014-09-05 DIAGNOSIS — D631 Anemia in chronic kidney disease: Secondary | ICD-10-CM | POA: Diagnosis not present

## 2014-09-05 DIAGNOSIS — D509 Iron deficiency anemia, unspecified: Secondary | ICD-10-CM | POA: Diagnosis not present

## 2014-09-05 DIAGNOSIS — N186 End stage renal disease: Secondary | ICD-10-CM | POA: Diagnosis not present

## 2014-09-06 DIAGNOSIS — Z992 Dependence on renal dialysis: Secondary | ICD-10-CM | POA: Diagnosis not present

## 2014-09-06 DIAGNOSIS — N186 End stage renal disease: Secondary | ICD-10-CM | POA: Diagnosis not present

## 2014-09-07 DIAGNOSIS — N186 End stage renal disease: Secondary | ICD-10-CM | POA: Diagnosis not present

## 2014-09-07 DIAGNOSIS — Z992 Dependence on renal dialysis: Secondary | ICD-10-CM | POA: Diagnosis not present

## 2014-09-07 DIAGNOSIS — D631 Anemia in chronic kidney disease: Secondary | ICD-10-CM | POA: Diagnosis not present

## 2014-09-07 DIAGNOSIS — D509 Iron deficiency anemia, unspecified: Secondary | ICD-10-CM | POA: Diagnosis not present

## 2014-09-07 DIAGNOSIS — R32 Unspecified urinary incontinence: Secondary | ICD-10-CM | POA: Diagnosis not present

## 2014-09-09 DIAGNOSIS — Z992 Dependence on renal dialysis: Secondary | ICD-10-CM | POA: Diagnosis not present

## 2014-09-09 DIAGNOSIS — N186 End stage renal disease: Secondary | ICD-10-CM | POA: Diagnosis not present

## 2014-09-09 DIAGNOSIS — D631 Anemia in chronic kidney disease: Secondary | ICD-10-CM | POA: Diagnosis not present

## 2014-09-09 DIAGNOSIS — D509 Iron deficiency anemia, unspecified: Secondary | ICD-10-CM | POA: Diagnosis not present

## 2014-09-12 DIAGNOSIS — N186 End stage renal disease: Secondary | ICD-10-CM | POA: Diagnosis not present

## 2014-09-12 DIAGNOSIS — D631 Anemia in chronic kidney disease: Secondary | ICD-10-CM | POA: Diagnosis not present

## 2014-09-12 DIAGNOSIS — Z992 Dependence on renal dialysis: Secondary | ICD-10-CM | POA: Diagnosis not present

## 2014-09-12 DIAGNOSIS — D509 Iron deficiency anemia, unspecified: Secondary | ICD-10-CM | POA: Diagnosis not present

## 2014-09-14 DIAGNOSIS — D509 Iron deficiency anemia, unspecified: Secondary | ICD-10-CM | POA: Diagnosis not present

## 2014-09-14 DIAGNOSIS — N186 End stage renal disease: Secondary | ICD-10-CM | POA: Diagnosis not present

## 2014-09-14 DIAGNOSIS — Z992 Dependence on renal dialysis: Secondary | ICD-10-CM | POA: Diagnosis not present

## 2014-09-14 DIAGNOSIS — D631 Anemia in chronic kidney disease: Secondary | ICD-10-CM | POA: Diagnosis not present

## 2014-09-16 DIAGNOSIS — Z992 Dependence on renal dialysis: Secondary | ICD-10-CM | POA: Diagnosis not present

## 2014-09-16 DIAGNOSIS — N186 End stage renal disease: Secondary | ICD-10-CM | POA: Diagnosis not present

## 2014-09-16 DIAGNOSIS — D509 Iron deficiency anemia, unspecified: Secondary | ICD-10-CM | POA: Diagnosis not present

## 2014-09-16 DIAGNOSIS — D631 Anemia in chronic kidney disease: Secondary | ICD-10-CM | POA: Diagnosis not present

## 2014-09-19 DIAGNOSIS — N186 End stage renal disease: Secondary | ICD-10-CM | POA: Diagnosis not present

## 2014-09-19 DIAGNOSIS — D509 Iron deficiency anemia, unspecified: Secondary | ICD-10-CM | POA: Diagnosis not present

## 2014-09-19 DIAGNOSIS — D631 Anemia in chronic kidney disease: Secondary | ICD-10-CM | POA: Diagnosis not present

## 2014-09-19 DIAGNOSIS — Z992 Dependence on renal dialysis: Secondary | ICD-10-CM | POA: Diagnosis not present

## 2014-09-21 DIAGNOSIS — Z992 Dependence on renal dialysis: Secondary | ICD-10-CM | POA: Diagnosis not present

## 2014-09-21 DIAGNOSIS — D631 Anemia in chronic kidney disease: Secondary | ICD-10-CM | POA: Diagnosis not present

## 2014-09-21 DIAGNOSIS — D509 Iron deficiency anemia, unspecified: Secondary | ICD-10-CM | POA: Diagnosis not present

## 2014-09-21 DIAGNOSIS — N186 End stage renal disease: Secondary | ICD-10-CM | POA: Diagnosis not present

## 2014-09-23 DIAGNOSIS — D631 Anemia in chronic kidney disease: Secondary | ICD-10-CM | POA: Diagnosis not present

## 2014-09-23 DIAGNOSIS — Z992 Dependence on renal dialysis: Secondary | ICD-10-CM | POA: Diagnosis not present

## 2014-09-23 DIAGNOSIS — D509 Iron deficiency anemia, unspecified: Secondary | ICD-10-CM | POA: Diagnosis not present

## 2014-09-23 DIAGNOSIS — N186 End stage renal disease: Secondary | ICD-10-CM | POA: Diagnosis not present

## 2014-09-26 DIAGNOSIS — N186 End stage renal disease: Secondary | ICD-10-CM | POA: Diagnosis not present

## 2014-09-26 DIAGNOSIS — D509 Iron deficiency anemia, unspecified: Secondary | ICD-10-CM | POA: Diagnosis not present

## 2014-09-26 DIAGNOSIS — D631 Anemia in chronic kidney disease: Secondary | ICD-10-CM | POA: Diagnosis not present

## 2014-09-26 DIAGNOSIS — Z992 Dependence on renal dialysis: Secondary | ICD-10-CM | POA: Diagnosis not present

## 2014-09-28 DIAGNOSIS — D631 Anemia in chronic kidney disease: Secondary | ICD-10-CM | POA: Diagnosis not present

## 2014-09-28 DIAGNOSIS — Z992 Dependence on renal dialysis: Secondary | ICD-10-CM | POA: Diagnosis not present

## 2014-09-28 DIAGNOSIS — N186 End stage renal disease: Secondary | ICD-10-CM | POA: Diagnosis not present

## 2014-09-28 DIAGNOSIS — D509 Iron deficiency anemia, unspecified: Secondary | ICD-10-CM | POA: Diagnosis not present

## 2014-09-30 DIAGNOSIS — N186 End stage renal disease: Secondary | ICD-10-CM | POA: Diagnosis not present

## 2014-09-30 DIAGNOSIS — D509 Iron deficiency anemia, unspecified: Secondary | ICD-10-CM | POA: Diagnosis not present

## 2014-09-30 DIAGNOSIS — Z992 Dependence on renal dialysis: Secondary | ICD-10-CM | POA: Diagnosis not present

## 2014-09-30 DIAGNOSIS — D631 Anemia in chronic kidney disease: Secondary | ICD-10-CM | POA: Diagnosis not present

## 2014-10-03 DIAGNOSIS — Z992 Dependence on renal dialysis: Secondary | ICD-10-CM | POA: Diagnosis not present

## 2014-10-03 DIAGNOSIS — N186 End stage renal disease: Secondary | ICD-10-CM | POA: Diagnosis not present

## 2014-10-03 DIAGNOSIS — E785 Hyperlipidemia, unspecified: Secondary | ICD-10-CM | POA: Diagnosis not present

## 2014-10-03 DIAGNOSIS — D509 Iron deficiency anemia, unspecified: Secondary | ICD-10-CM | POA: Diagnosis not present

## 2014-10-03 DIAGNOSIS — D631 Anemia in chronic kidney disease: Secondary | ICD-10-CM | POA: Diagnosis not present

## 2014-10-05 DIAGNOSIS — Z992 Dependence on renal dialysis: Secondary | ICD-10-CM | POA: Diagnosis not present

## 2014-10-05 DIAGNOSIS — D509 Iron deficiency anemia, unspecified: Secondary | ICD-10-CM | POA: Diagnosis not present

## 2014-10-05 DIAGNOSIS — D631 Anemia in chronic kidney disease: Secondary | ICD-10-CM | POA: Diagnosis not present

## 2014-10-05 DIAGNOSIS — N186 End stage renal disease: Secondary | ICD-10-CM | POA: Diagnosis not present

## 2014-10-06 DIAGNOSIS — Z992 Dependence on renal dialysis: Secondary | ICD-10-CM | POA: Diagnosis not present

## 2014-10-06 DIAGNOSIS — N186 End stage renal disease: Secondary | ICD-10-CM | POA: Diagnosis not present

## 2014-10-07 DIAGNOSIS — Z992 Dependence on renal dialysis: Secondary | ICD-10-CM | POA: Diagnosis not present

## 2014-10-07 DIAGNOSIS — D631 Anemia in chronic kidney disease: Secondary | ICD-10-CM | POA: Diagnosis not present

## 2014-10-07 DIAGNOSIS — N186 End stage renal disease: Secondary | ICD-10-CM | POA: Diagnosis not present

## 2014-10-07 DIAGNOSIS — R32 Unspecified urinary incontinence: Secondary | ICD-10-CM | POA: Diagnosis not present

## 2014-10-10 DIAGNOSIS — Z992 Dependence on renal dialysis: Secondary | ICD-10-CM | POA: Diagnosis not present

## 2014-10-10 DIAGNOSIS — D631 Anemia in chronic kidney disease: Secondary | ICD-10-CM | POA: Diagnosis not present

## 2014-10-10 DIAGNOSIS — N186 End stage renal disease: Secondary | ICD-10-CM | POA: Diagnosis not present

## 2014-10-12 DIAGNOSIS — D631 Anemia in chronic kidney disease: Secondary | ICD-10-CM | POA: Diagnosis not present

## 2014-10-12 DIAGNOSIS — N186 End stage renal disease: Secondary | ICD-10-CM | POA: Diagnosis not present

## 2014-10-12 DIAGNOSIS — Z992 Dependence on renal dialysis: Secondary | ICD-10-CM | POA: Diagnosis not present

## 2014-10-14 DIAGNOSIS — N186 End stage renal disease: Secondary | ICD-10-CM | POA: Diagnosis not present

## 2014-10-14 DIAGNOSIS — Z992 Dependence on renal dialysis: Secondary | ICD-10-CM | POA: Diagnosis not present

## 2014-10-14 DIAGNOSIS — D631 Anemia in chronic kidney disease: Secondary | ICD-10-CM | POA: Diagnosis not present

## 2014-10-17 DIAGNOSIS — D631 Anemia in chronic kidney disease: Secondary | ICD-10-CM | POA: Diagnosis not present

## 2014-10-17 DIAGNOSIS — N186 End stage renal disease: Secondary | ICD-10-CM | POA: Diagnosis not present

## 2014-10-17 DIAGNOSIS — Z992 Dependence on renal dialysis: Secondary | ICD-10-CM | POA: Diagnosis not present

## 2014-10-19 DIAGNOSIS — Z992 Dependence on renal dialysis: Secondary | ICD-10-CM | POA: Diagnosis not present

## 2014-10-19 DIAGNOSIS — D631 Anemia in chronic kidney disease: Secondary | ICD-10-CM | POA: Diagnosis not present

## 2014-10-19 DIAGNOSIS — N186 End stage renal disease: Secondary | ICD-10-CM | POA: Diagnosis not present

## 2014-10-21 DIAGNOSIS — D631 Anemia in chronic kidney disease: Secondary | ICD-10-CM | POA: Diagnosis not present

## 2014-10-21 DIAGNOSIS — Z992 Dependence on renal dialysis: Secondary | ICD-10-CM | POA: Diagnosis not present

## 2014-10-21 DIAGNOSIS — N186 End stage renal disease: Secondary | ICD-10-CM | POA: Diagnosis not present

## 2014-10-24 DIAGNOSIS — Z992 Dependence on renal dialysis: Secondary | ICD-10-CM | POA: Diagnosis not present

## 2014-10-24 DIAGNOSIS — D631 Anemia in chronic kidney disease: Secondary | ICD-10-CM | POA: Diagnosis not present

## 2014-10-24 DIAGNOSIS — N186 End stage renal disease: Secondary | ICD-10-CM | POA: Diagnosis not present

## 2014-10-26 DIAGNOSIS — Z992 Dependence on renal dialysis: Secondary | ICD-10-CM | POA: Diagnosis not present

## 2014-10-26 DIAGNOSIS — D631 Anemia in chronic kidney disease: Secondary | ICD-10-CM | POA: Diagnosis not present

## 2014-10-26 DIAGNOSIS — N186 End stage renal disease: Secondary | ICD-10-CM | POA: Diagnosis not present

## 2014-10-28 DIAGNOSIS — D631 Anemia in chronic kidney disease: Secondary | ICD-10-CM | POA: Diagnosis not present

## 2014-10-28 DIAGNOSIS — Z992 Dependence on renal dialysis: Secondary | ICD-10-CM | POA: Diagnosis not present

## 2014-10-28 DIAGNOSIS — N186 End stage renal disease: Secondary | ICD-10-CM | POA: Diagnosis not present

## 2014-10-31 DIAGNOSIS — N186 End stage renal disease: Secondary | ICD-10-CM | POA: Diagnosis not present

## 2014-10-31 DIAGNOSIS — Z992 Dependence on renal dialysis: Secondary | ICD-10-CM | POA: Diagnosis not present

## 2014-10-31 DIAGNOSIS — D631 Anemia in chronic kidney disease: Secondary | ICD-10-CM | POA: Diagnosis not present

## 2014-11-02 DIAGNOSIS — N186 End stage renal disease: Secondary | ICD-10-CM | POA: Diagnosis not present

## 2014-11-02 DIAGNOSIS — Z992 Dependence on renal dialysis: Secondary | ICD-10-CM | POA: Diagnosis not present

## 2014-11-02 DIAGNOSIS — D631 Anemia in chronic kidney disease: Secondary | ICD-10-CM | POA: Diagnosis not present

## 2014-11-04 DIAGNOSIS — N186 End stage renal disease: Secondary | ICD-10-CM | POA: Diagnosis not present

## 2014-11-04 DIAGNOSIS — Z992 Dependence on renal dialysis: Secondary | ICD-10-CM | POA: Diagnosis not present

## 2014-11-04 DIAGNOSIS — D631 Anemia in chronic kidney disease: Secondary | ICD-10-CM | POA: Diagnosis not present

## 2014-11-06 DIAGNOSIS — N186 End stage renal disease: Secondary | ICD-10-CM | POA: Diagnosis not present

## 2014-11-06 DIAGNOSIS — Z992 Dependence on renal dialysis: Secondary | ICD-10-CM | POA: Diagnosis not present

## 2014-11-07 DIAGNOSIS — R32 Unspecified urinary incontinence: Secondary | ICD-10-CM | POA: Diagnosis not present

## 2014-11-07 DIAGNOSIS — D631 Anemia in chronic kidney disease: Secondary | ICD-10-CM | POA: Diagnosis not present

## 2014-11-07 DIAGNOSIS — Z992 Dependence on renal dialysis: Secondary | ICD-10-CM | POA: Diagnosis not present

## 2014-11-07 DIAGNOSIS — N186 End stage renal disease: Secondary | ICD-10-CM | POA: Diagnosis not present

## 2014-11-09 DIAGNOSIS — N186 End stage renal disease: Secondary | ICD-10-CM | POA: Diagnosis not present

## 2014-11-09 DIAGNOSIS — D631 Anemia in chronic kidney disease: Secondary | ICD-10-CM | POA: Diagnosis not present

## 2014-11-09 DIAGNOSIS — Z992 Dependence on renal dialysis: Secondary | ICD-10-CM | POA: Diagnosis not present

## 2014-11-11 DIAGNOSIS — N186 End stage renal disease: Secondary | ICD-10-CM | POA: Diagnosis not present

## 2014-11-11 DIAGNOSIS — D631 Anemia in chronic kidney disease: Secondary | ICD-10-CM | POA: Diagnosis not present

## 2014-11-11 DIAGNOSIS — Z992 Dependence on renal dialysis: Secondary | ICD-10-CM | POA: Diagnosis not present

## 2014-11-14 DIAGNOSIS — Z992 Dependence on renal dialysis: Secondary | ICD-10-CM | POA: Diagnosis not present

## 2014-11-14 DIAGNOSIS — D631 Anemia in chronic kidney disease: Secondary | ICD-10-CM | POA: Diagnosis not present

## 2014-11-14 DIAGNOSIS — N186 End stage renal disease: Secondary | ICD-10-CM | POA: Diagnosis not present

## 2014-11-16 DIAGNOSIS — N186 End stage renal disease: Secondary | ICD-10-CM | POA: Diagnosis not present

## 2014-11-16 DIAGNOSIS — D631 Anemia in chronic kidney disease: Secondary | ICD-10-CM | POA: Diagnosis not present

## 2014-11-16 DIAGNOSIS — Z992 Dependence on renal dialysis: Secondary | ICD-10-CM | POA: Diagnosis not present

## 2014-11-18 DIAGNOSIS — D631 Anemia in chronic kidney disease: Secondary | ICD-10-CM | POA: Diagnosis not present

## 2014-11-18 DIAGNOSIS — Z992 Dependence on renal dialysis: Secondary | ICD-10-CM | POA: Diagnosis not present

## 2014-11-18 DIAGNOSIS — N186 End stage renal disease: Secondary | ICD-10-CM | POA: Diagnosis not present

## 2014-11-21 DIAGNOSIS — N186 End stage renal disease: Secondary | ICD-10-CM | POA: Diagnosis not present

## 2014-11-21 DIAGNOSIS — Z992 Dependence on renal dialysis: Secondary | ICD-10-CM | POA: Diagnosis not present

## 2014-11-21 DIAGNOSIS — D631 Anemia in chronic kidney disease: Secondary | ICD-10-CM | POA: Diagnosis not present

## 2014-11-23 DIAGNOSIS — N186 End stage renal disease: Secondary | ICD-10-CM | POA: Diagnosis not present

## 2014-11-23 DIAGNOSIS — D631 Anemia in chronic kidney disease: Secondary | ICD-10-CM | POA: Diagnosis not present

## 2014-11-23 DIAGNOSIS — Z992 Dependence on renal dialysis: Secondary | ICD-10-CM | POA: Diagnosis not present

## 2014-11-25 DIAGNOSIS — D631 Anemia in chronic kidney disease: Secondary | ICD-10-CM | POA: Diagnosis not present

## 2014-11-25 DIAGNOSIS — N186 End stage renal disease: Secondary | ICD-10-CM | POA: Diagnosis not present

## 2014-11-25 DIAGNOSIS — Z992 Dependence on renal dialysis: Secondary | ICD-10-CM | POA: Diagnosis not present

## 2014-11-28 DIAGNOSIS — Z992 Dependence on renal dialysis: Secondary | ICD-10-CM | POA: Diagnosis not present

## 2014-11-28 DIAGNOSIS — D631 Anemia in chronic kidney disease: Secondary | ICD-10-CM | POA: Diagnosis not present

## 2014-11-28 DIAGNOSIS — N186 End stage renal disease: Secondary | ICD-10-CM | POA: Diagnosis not present

## 2014-11-30 DIAGNOSIS — N186 End stage renal disease: Secondary | ICD-10-CM | POA: Diagnosis not present

## 2014-11-30 DIAGNOSIS — Z992 Dependence on renal dialysis: Secondary | ICD-10-CM | POA: Diagnosis not present

## 2014-11-30 DIAGNOSIS — D631 Anemia in chronic kidney disease: Secondary | ICD-10-CM | POA: Diagnosis not present

## 2014-12-02 DIAGNOSIS — Z992 Dependence on renal dialysis: Secondary | ICD-10-CM | POA: Diagnosis not present

## 2014-12-02 DIAGNOSIS — N186 End stage renal disease: Secondary | ICD-10-CM | POA: Diagnosis not present

## 2014-12-02 DIAGNOSIS — D631 Anemia in chronic kidney disease: Secondary | ICD-10-CM | POA: Diagnosis not present

## 2014-12-04 DIAGNOSIS — J189 Pneumonia, unspecified organism: Secondary | ICD-10-CM | POA: Diagnosis not present

## 2014-12-04 DIAGNOSIS — Z992 Dependence on renal dialysis: Secondary | ICD-10-CM | POA: Diagnosis not present

## 2014-12-04 DIAGNOSIS — M8588 Other specified disorders of bone density and structure, other site: Secondary | ICD-10-CM | POA: Diagnosis not present

## 2014-12-04 DIAGNOSIS — I10 Essential (primary) hypertension: Secondary | ICD-10-CM | POA: Diagnosis not present

## 2014-12-04 DIAGNOSIS — R062 Wheezing: Secondary | ICD-10-CM | POA: Diagnosis not present

## 2014-12-04 DIAGNOSIS — N186 End stage renal disease: Secondary | ICD-10-CM | POA: Diagnosis not present

## 2014-12-04 DIAGNOSIS — I4891 Unspecified atrial fibrillation: Secondary | ICD-10-CM | POA: Diagnosis not present

## 2014-12-04 DIAGNOSIS — I120 Hypertensive chronic kidney disease with stage 5 chronic kidney disease or end stage renal disease: Secondary | ICD-10-CM | POA: Diagnosis not present

## 2014-12-04 DIAGNOSIS — M546 Pain in thoracic spine: Secondary | ICD-10-CM | POA: Diagnosis not present

## 2014-12-04 DIAGNOSIS — R0989 Other specified symptoms and signs involving the circulatory and respiratory systems: Secondary | ICD-10-CM | POA: Diagnosis not present

## 2014-12-04 DIAGNOSIS — M4856XA Collapsed vertebra, not elsewhere classified, lumbar region, initial encounter for fracture: Secondary | ICD-10-CM | POA: Diagnosis not present

## 2014-12-04 DIAGNOSIS — R918 Other nonspecific abnormal finding of lung field: Secondary | ICD-10-CM | POA: Diagnosis not present

## 2014-12-04 DIAGNOSIS — K5730 Diverticulosis of large intestine without perforation or abscess without bleeding: Secondary | ICD-10-CM | POA: Diagnosis not present

## 2014-12-04 DIAGNOSIS — N19 Unspecified kidney failure: Secondary | ICD-10-CM | POA: Diagnosis not present

## 2014-12-04 DIAGNOSIS — J449 Chronic obstructive pulmonary disease, unspecified: Secondary | ICD-10-CM | POA: Diagnosis not present

## 2014-12-04 DIAGNOSIS — R52 Pain, unspecified: Secondary | ICD-10-CM | POA: Diagnosis not present

## 2014-12-04 DIAGNOSIS — R269 Unspecified abnormalities of gait and mobility: Secondary | ICD-10-CM | POA: Diagnosis not present

## 2014-12-04 DIAGNOSIS — R0602 Shortness of breath: Secondary | ICD-10-CM | POA: Diagnosis not present

## 2014-12-04 DIAGNOSIS — K4020 Bilateral inguinal hernia, without obstruction or gangrene, not specified as recurrent: Secondary | ICD-10-CM | POA: Diagnosis not present

## 2014-12-04 DIAGNOSIS — N261 Atrophy of kidney (terminal): Secondary | ICD-10-CM | POA: Diagnosis not present

## 2014-12-04 DIAGNOSIS — S32020A Wedge compression fracture of second lumbar vertebra, initial encounter for closed fracture: Secondary | ICD-10-CM | POA: Diagnosis not present

## 2014-12-05 DIAGNOSIS — J449 Chronic obstructive pulmonary disease, unspecified: Secondary | ICD-10-CM | POA: Diagnosis not present

## 2014-12-05 DIAGNOSIS — T148 Other injury of unspecified body region: Secondary | ICD-10-CM | POA: Diagnosis not present

## 2014-12-05 DIAGNOSIS — J189 Pneumonia, unspecified organism: Secondary | ICD-10-CM | POA: Diagnosis not present

## 2014-12-05 DIAGNOSIS — Z992 Dependence on renal dialysis: Secondary | ICD-10-CM | POA: Diagnosis not present

## 2014-12-05 DIAGNOSIS — N186 End stage renal disease: Secondary | ICD-10-CM | POA: Diagnosis not present

## 2014-12-06 DIAGNOSIS — Z7401 Bed confinement status: Secondary | ICD-10-CM | POA: Diagnosis not present

## 2014-12-06 DIAGNOSIS — N186 End stage renal disease: Secondary | ICD-10-CM | POA: Diagnosis not present

## 2014-12-06 DIAGNOSIS — Z992 Dependence on renal dialysis: Secondary | ICD-10-CM | POA: Diagnosis not present

## 2014-12-06 DIAGNOSIS — J441 Chronic obstructive pulmonary disease with (acute) exacerbation: Secondary | ICD-10-CM | POA: Diagnosis not present

## 2014-12-06 DIAGNOSIS — S32010A Wedge compression fracture of first lumbar vertebra, initial encounter for closed fracture: Secondary | ICD-10-CM | POA: Diagnosis not present

## 2014-12-06 DIAGNOSIS — R279 Unspecified lack of coordination: Secondary | ICD-10-CM | POA: Diagnosis not present

## 2014-12-06 DIAGNOSIS — J189 Pneumonia, unspecified organism: Secondary | ICD-10-CM | POA: Diagnosis not present

## 2014-12-06 DIAGNOSIS — J449 Chronic obstructive pulmonary disease, unspecified: Secondary | ICD-10-CM | POA: Diagnosis not present

## 2014-12-07 DIAGNOSIS — N186 End stage renal disease: Secondary | ICD-10-CM | POA: Diagnosis not present

## 2014-12-07 DIAGNOSIS — Z992 Dependence on renal dialysis: Secondary | ICD-10-CM | POA: Diagnosis not present

## 2014-12-07 DIAGNOSIS — D631 Anemia in chronic kidney disease: Secondary | ICD-10-CM | POA: Diagnosis not present

## 2014-12-08 DIAGNOSIS — J189 Pneumonia, unspecified organism: Secondary | ICD-10-CM | POA: Diagnosis not present

## 2014-12-08 DIAGNOSIS — S32000A Wedge compression fracture of unspecified lumbar vertebra, initial encounter for closed fracture: Secondary | ICD-10-CM | POA: Diagnosis not present

## 2014-12-08 DIAGNOSIS — I2510 Atherosclerotic heart disease of native coronary artery without angina pectoris: Secondary | ICD-10-CM | POA: Diagnosis not present

## 2014-12-08 DIAGNOSIS — Z7982 Long term (current) use of aspirin: Secondary | ICD-10-CM | POA: Diagnosis not present

## 2014-12-08 DIAGNOSIS — I509 Heart failure, unspecified: Secondary | ICD-10-CM | POA: Diagnosis not present

## 2014-12-08 DIAGNOSIS — Z9981 Dependence on supplemental oxygen: Secondary | ICD-10-CM | POA: Diagnosis not present

## 2014-12-08 DIAGNOSIS — Z992 Dependence on renal dialysis: Secondary | ICD-10-CM | POA: Diagnosis not present

## 2014-12-08 DIAGNOSIS — J440 Chronic obstructive pulmonary disease with acute lower respiratory infection: Secondary | ICD-10-CM | POA: Diagnosis not present

## 2014-12-08 DIAGNOSIS — R32 Unspecified urinary incontinence: Secondary | ICD-10-CM | POA: Diagnosis not present

## 2014-12-08 DIAGNOSIS — S32010A Wedge compression fracture of first lumbar vertebra, initial encounter for closed fracture: Secondary | ICD-10-CM | POA: Diagnosis not present

## 2014-12-08 DIAGNOSIS — I132 Hypertensive heart and chronic kidney disease with heart failure and with stage 5 chronic kidney disease, or end stage renal disease: Secondary | ICD-10-CM | POA: Diagnosis not present

## 2014-12-08 DIAGNOSIS — N186 End stage renal disease: Secondary | ICD-10-CM | POA: Diagnosis not present

## 2014-12-08 DIAGNOSIS — Z9181 History of falling: Secondary | ICD-10-CM | POA: Diagnosis not present

## 2014-12-09 DIAGNOSIS — N186 End stage renal disease: Secondary | ICD-10-CM | POA: Diagnosis not present

## 2014-12-09 DIAGNOSIS — Z23 Encounter for immunization: Secondary | ICD-10-CM | POA: Diagnosis not present

## 2014-12-09 DIAGNOSIS — D631 Anemia in chronic kidney disease: Secondary | ICD-10-CM | POA: Diagnosis not present

## 2014-12-09 DIAGNOSIS — J189 Pneumonia, unspecified organism: Secondary | ICD-10-CM | POA: Diagnosis not present

## 2014-12-09 DIAGNOSIS — T82898A Other specified complication of vascular prosthetic devices, implants and grafts, initial encounter: Secondary | ICD-10-CM | POA: Diagnosis not present

## 2014-12-09 DIAGNOSIS — S32010A Wedge compression fracture of first lumbar vertebra, initial encounter for closed fracture: Secondary | ICD-10-CM | POA: Diagnosis not present

## 2014-12-09 DIAGNOSIS — J440 Chronic obstructive pulmonary disease with acute lower respiratory infection: Secondary | ICD-10-CM | POA: Diagnosis not present

## 2014-12-09 DIAGNOSIS — Z9981 Dependence on supplemental oxygen: Secondary | ICD-10-CM | POA: Diagnosis not present

## 2014-12-09 DIAGNOSIS — Z7982 Long term (current) use of aspirin: Secondary | ICD-10-CM | POA: Diagnosis not present

## 2014-12-09 DIAGNOSIS — I509 Heart failure, unspecified: Secondary | ICD-10-CM | POA: Diagnosis not present

## 2014-12-09 DIAGNOSIS — I2510 Atherosclerotic heart disease of native coronary artery without angina pectoris: Secondary | ICD-10-CM | POA: Diagnosis not present

## 2014-12-09 DIAGNOSIS — Z992 Dependence on renal dialysis: Secondary | ICD-10-CM | POA: Diagnosis not present

## 2014-12-09 DIAGNOSIS — I132 Hypertensive heart and chronic kidney disease with heart failure and with stage 5 chronic kidney disease, or end stage renal disease: Secondary | ICD-10-CM | POA: Diagnosis not present

## 2014-12-09 DIAGNOSIS — Z9181 History of falling: Secondary | ICD-10-CM | POA: Diagnosis not present

## 2014-12-12 DIAGNOSIS — T82898A Other specified complication of vascular prosthetic devices, implants and grafts, initial encounter: Secondary | ICD-10-CM | POA: Diagnosis not present

## 2014-12-12 DIAGNOSIS — Z992 Dependence on renal dialysis: Secondary | ICD-10-CM | POA: Diagnosis not present

## 2014-12-12 DIAGNOSIS — D631 Anemia in chronic kidney disease: Secondary | ICD-10-CM | POA: Diagnosis not present

## 2014-12-12 DIAGNOSIS — N186 End stage renal disease: Secondary | ICD-10-CM | POA: Diagnosis not present

## 2014-12-12 DIAGNOSIS — Z23 Encounter for immunization: Secondary | ICD-10-CM | POA: Diagnosis not present

## 2014-12-13 DIAGNOSIS — I132 Hypertensive heart and chronic kidney disease with heart failure and with stage 5 chronic kidney disease, or end stage renal disease: Secondary | ICD-10-CM | POA: Diagnosis not present

## 2014-12-13 DIAGNOSIS — Z9181 History of falling: Secondary | ICD-10-CM | POA: Diagnosis not present

## 2014-12-13 DIAGNOSIS — M4856XA Collapsed vertebra, not elsewhere classified, lumbar region, initial encounter for fracture: Secondary | ICD-10-CM | POA: Diagnosis not present

## 2014-12-13 DIAGNOSIS — Z7982 Long term (current) use of aspirin: Secondary | ICD-10-CM | POA: Diagnosis not present

## 2014-12-13 DIAGNOSIS — M545 Low back pain: Secondary | ICD-10-CM | POA: Diagnosis not present

## 2014-12-13 DIAGNOSIS — Z992 Dependence on renal dialysis: Secondary | ICD-10-CM | POA: Diagnosis not present

## 2014-12-13 DIAGNOSIS — J449 Chronic obstructive pulmonary disease, unspecified: Secondary | ICD-10-CM | POA: Diagnosis not present

## 2014-12-13 DIAGNOSIS — J440 Chronic obstructive pulmonary disease with acute lower respiratory infection: Secondary | ICD-10-CM | POA: Diagnosis not present

## 2014-12-13 DIAGNOSIS — I120 Hypertensive chronic kidney disease with stage 5 chronic kidney disease or end stage renal disease: Secondary | ICD-10-CM | POA: Diagnosis not present

## 2014-12-13 DIAGNOSIS — Z9981 Dependence on supplemental oxygen: Secondary | ICD-10-CM | POA: Diagnosis not present

## 2014-12-13 DIAGNOSIS — Z79899 Other long term (current) drug therapy: Secondary | ICD-10-CM | POA: Diagnosis not present

## 2014-12-13 DIAGNOSIS — N186 End stage renal disease: Secondary | ICD-10-CM | POA: Diagnosis not present

## 2014-12-13 DIAGNOSIS — I2510 Atherosclerotic heart disease of native coronary artery without angina pectoris: Secondary | ICD-10-CM | POA: Diagnosis not present

## 2014-12-13 DIAGNOSIS — Z7401 Bed confinement status: Secondary | ICD-10-CM | POA: Diagnosis not present

## 2014-12-13 DIAGNOSIS — Z8546 Personal history of malignant neoplasm of prostate: Secondary | ICD-10-CM | POA: Diagnosis not present

## 2014-12-13 DIAGNOSIS — I509 Heart failure, unspecified: Secondary | ICD-10-CM | POA: Diagnosis not present

## 2014-12-13 DIAGNOSIS — J189 Pneumonia, unspecified organism: Secondary | ICD-10-CM | POA: Diagnosis not present

## 2014-12-13 DIAGNOSIS — S32010A Wedge compression fracture of first lumbar vertebra, initial encounter for closed fracture: Secondary | ICD-10-CM | POA: Diagnosis not present

## 2014-12-13 DIAGNOSIS — R030 Elevated blood-pressure reading, without diagnosis of hypertension: Secondary | ICD-10-CM | POA: Diagnosis not present

## 2014-12-14 DIAGNOSIS — Z992 Dependence on renal dialysis: Secondary | ICD-10-CM | POA: Diagnosis not present

## 2014-12-14 DIAGNOSIS — D631 Anemia in chronic kidney disease: Secondary | ICD-10-CM | POA: Diagnosis not present

## 2014-12-14 DIAGNOSIS — N186 End stage renal disease: Secondary | ICD-10-CM | POA: Diagnosis not present

## 2014-12-14 DIAGNOSIS — Z23 Encounter for immunization: Secondary | ICD-10-CM | POA: Diagnosis not present

## 2014-12-14 DIAGNOSIS — T82898A Other specified complication of vascular prosthetic devices, implants and grafts, initial encounter: Secondary | ICD-10-CM | POA: Diagnosis not present

## 2014-12-15 DIAGNOSIS — J440 Chronic obstructive pulmonary disease with acute lower respiratory infection: Secondary | ICD-10-CM | POA: Diagnosis not present

## 2014-12-15 DIAGNOSIS — Z7982 Long term (current) use of aspirin: Secondary | ICD-10-CM | POA: Diagnosis not present

## 2014-12-15 DIAGNOSIS — I2510 Atherosclerotic heart disease of native coronary artery without angina pectoris: Secondary | ICD-10-CM | POA: Diagnosis not present

## 2014-12-15 DIAGNOSIS — S32010A Wedge compression fracture of first lumbar vertebra, initial encounter for closed fracture: Secondary | ICD-10-CM | POA: Diagnosis not present

## 2014-12-15 DIAGNOSIS — Z9981 Dependence on supplemental oxygen: Secondary | ICD-10-CM | POA: Diagnosis not present

## 2014-12-15 DIAGNOSIS — Z992 Dependence on renal dialysis: Secondary | ICD-10-CM | POA: Diagnosis not present

## 2014-12-15 DIAGNOSIS — N186 End stage renal disease: Secondary | ICD-10-CM | POA: Diagnosis not present

## 2014-12-15 DIAGNOSIS — Z9181 History of falling: Secondary | ICD-10-CM | POA: Diagnosis not present

## 2014-12-15 DIAGNOSIS — I509 Heart failure, unspecified: Secondary | ICD-10-CM | POA: Diagnosis not present

## 2014-12-15 DIAGNOSIS — I132 Hypertensive heart and chronic kidney disease with heart failure and with stage 5 chronic kidney disease, or end stage renal disease: Secondary | ICD-10-CM | POA: Diagnosis not present

## 2014-12-15 DIAGNOSIS — J189 Pneumonia, unspecified organism: Secondary | ICD-10-CM | POA: Diagnosis not present

## 2014-12-16 DIAGNOSIS — Z7401 Bed confinement status: Secondary | ICD-10-CM | POA: Diagnosis not present

## 2014-12-16 DIAGNOSIS — L989 Disorder of the skin and subcutaneous tissue, unspecified: Secondary | ICD-10-CM | POA: Diagnosis not present

## 2014-12-16 DIAGNOSIS — R52 Pain, unspecified: Secondary | ICD-10-CM | POA: Diagnosis not present

## 2014-12-16 DIAGNOSIS — Z79899 Other long term (current) drug therapy: Secondary | ICD-10-CM | POA: Diagnosis not present

## 2014-12-16 DIAGNOSIS — S32018D Other fracture of first lumbar vertebra, subsequent encounter for fracture with routine healing: Secondary | ICD-10-CM | POA: Diagnosis not present

## 2014-12-16 DIAGNOSIS — M549 Dorsalgia, unspecified: Secondary | ICD-10-CM | POA: Diagnosis not present

## 2014-12-16 DIAGNOSIS — M545 Low back pain: Secondary | ICD-10-CM | POA: Diagnosis not present

## 2014-12-16 DIAGNOSIS — N186 End stage renal disease: Secondary | ICD-10-CM | POA: Diagnosis not present

## 2014-12-16 DIAGNOSIS — R279 Unspecified lack of coordination: Secondary | ICD-10-CM | POA: Diagnosis not present

## 2014-12-16 DIAGNOSIS — J449 Chronic obstructive pulmonary disease, unspecified: Secondary | ICD-10-CM | POA: Diagnosis not present

## 2014-12-16 DIAGNOSIS — Z23 Encounter for immunization: Secondary | ICD-10-CM | POA: Diagnosis not present

## 2014-12-16 DIAGNOSIS — Z992 Dependence on renal dialysis: Secondary | ICD-10-CM | POA: Diagnosis not present

## 2014-12-16 DIAGNOSIS — T82898A Other specified complication of vascular prosthetic devices, implants and grafts, initial encounter: Secondary | ICD-10-CM | POA: Diagnosis not present

## 2014-12-16 DIAGNOSIS — I120 Hypertensive chronic kidney disease with stage 5 chronic kidney disease or end stage renal disease: Secondary | ICD-10-CM | POA: Diagnosis not present

## 2014-12-16 DIAGNOSIS — D631 Anemia in chronic kidney disease: Secondary | ICD-10-CM | POA: Diagnosis not present

## 2014-12-16 DIAGNOSIS — S32019D Unspecified fracture of first lumbar vertebra, subsequent encounter for fracture with routine healing: Secondary | ICD-10-CM | POA: Diagnosis not present

## 2014-12-16 DIAGNOSIS — W19XXXD Unspecified fall, subsequent encounter: Secondary | ICD-10-CM | POA: Diagnosis not present

## 2014-12-20 DIAGNOSIS — N186 End stage renal disease: Secondary | ICD-10-CM | POA: Diagnosis not present

## 2014-12-20 DIAGNOSIS — M545 Low back pain: Secondary | ICD-10-CM | POA: Diagnosis not present

## 2014-12-20 DIAGNOSIS — R069 Unspecified abnormalities of breathing: Secondary | ICD-10-CM | POA: Diagnosis not present

## 2014-12-20 DIAGNOSIS — J449 Chronic obstructive pulmonary disease, unspecified: Secondary | ICD-10-CM | POA: Diagnosis not present

## 2014-12-20 DIAGNOSIS — I959 Hypotension, unspecified: Secondary | ICD-10-CM | POA: Diagnosis not present

## 2014-12-20 DIAGNOSIS — G8929 Other chronic pain: Secondary | ICD-10-CM | POA: Diagnosis not present

## 2014-12-20 DIAGNOSIS — N189 Chronic kidney disease, unspecified: Secondary | ICD-10-CM | POA: Diagnosis not present

## 2014-12-20 DIAGNOSIS — I2510 Atherosclerotic heart disease of native coronary artery without angina pectoris: Secondary | ICD-10-CM | POA: Diagnosis not present

## 2014-12-20 DIAGNOSIS — I9589 Other hypotension: Secondary | ICD-10-CM | POA: Diagnosis not present

## 2014-12-20 DIAGNOSIS — D72829 Elevated white blood cell count, unspecified: Secondary | ICD-10-CM | POA: Diagnosis not present

## 2014-12-20 DIAGNOSIS — Z79899 Other long term (current) drug therapy: Secondary | ICD-10-CM | POA: Diagnosis not present

## 2014-12-20 DIAGNOSIS — Z7951 Long term (current) use of inhaled steroids: Secondary | ICD-10-CM | POA: Diagnosis not present

## 2014-12-20 DIAGNOSIS — C61 Malignant neoplasm of prostate: Secondary | ICD-10-CM | POA: Diagnosis not present

## 2014-12-20 DIAGNOSIS — I482 Chronic atrial fibrillation: Secondary | ICD-10-CM | POA: Diagnosis not present

## 2014-12-21 DIAGNOSIS — N186 End stage renal disease: Secondary | ICD-10-CM | POA: Diagnosis not present

## 2014-12-21 DIAGNOSIS — D631 Anemia in chronic kidney disease: Secondary | ICD-10-CM | POA: Diagnosis not present

## 2014-12-21 DIAGNOSIS — I517 Cardiomegaly: Secondary | ICD-10-CM | POA: Diagnosis not present

## 2014-12-21 DIAGNOSIS — I9589 Other hypotension: Secondary | ICD-10-CM | POA: Diagnosis not present

## 2014-12-21 DIAGNOSIS — I482 Chronic atrial fibrillation: Secondary | ICD-10-CM | POA: Diagnosis not present

## 2014-12-21 DIAGNOSIS — I959 Hypotension, unspecified: Secondary | ICD-10-CM | POA: Diagnosis not present

## 2014-12-21 DIAGNOSIS — J189 Pneumonia, unspecified organism: Secondary | ICD-10-CM | POA: Diagnosis not present

## 2014-12-21 DIAGNOSIS — Z992 Dependence on renal dialysis: Secondary | ICD-10-CM | POA: Diagnosis not present

## 2014-12-21 DIAGNOSIS — I2510 Atherosclerotic heart disease of native coronary artery without angina pectoris: Secondary | ICD-10-CM | POA: Diagnosis not present

## 2014-12-22 DIAGNOSIS — R279 Unspecified lack of coordination: Secondary | ICD-10-CM | POA: Diagnosis not present

## 2014-12-22 DIAGNOSIS — I482 Chronic atrial fibrillation: Secondary | ICD-10-CM | POA: Diagnosis not present

## 2014-12-22 DIAGNOSIS — Z7401 Bed confinement status: Secondary | ICD-10-CM | POA: Diagnosis not present

## 2014-12-22 DIAGNOSIS — I2510 Atherosclerotic heart disease of native coronary artery without angina pectoris: Secondary | ICD-10-CM | POA: Diagnosis not present

## 2014-12-22 DIAGNOSIS — I9589 Other hypotension: Secondary | ICD-10-CM | POA: Diagnosis not present

## 2014-12-22 DIAGNOSIS — N189 Chronic kidney disease, unspecified: Secondary | ICD-10-CM | POA: Diagnosis not present

## 2014-12-23 DIAGNOSIS — J440 Chronic obstructive pulmonary disease with acute lower respiratory infection: Secondary | ICD-10-CM | POA: Diagnosis not present

## 2014-12-23 DIAGNOSIS — I509 Heart failure, unspecified: Secondary | ICD-10-CM | POA: Diagnosis not present

## 2014-12-23 DIAGNOSIS — I132 Hypertensive heart and chronic kidney disease with heart failure and with stage 5 chronic kidney disease, or end stage renal disease: Secondary | ICD-10-CM | POA: Diagnosis not present

## 2014-12-23 DIAGNOSIS — D631 Anemia in chronic kidney disease: Secondary | ICD-10-CM | POA: Diagnosis not present

## 2014-12-23 DIAGNOSIS — Z23 Encounter for immunization: Secondary | ICD-10-CM | POA: Diagnosis not present

## 2014-12-23 DIAGNOSIS — I2510 Atherosclerotic heart disease of native coronary artery without angina pectoris: Secondary | ICD-10-CM | POA: Diagnosis not present

## 2014-12-23 DIAGNOSIS — S32010A Wedge compression fracture of first lumbar vertebra, initial encounter for closed fracture: Secondary | ICD-10-CM | POA: Diagnosis not present

## 2014-12-23 DIAGNOSIS — Z7982 Long term (current) use of aspirin: Secondary | ICD-10-CM | POA: Diagnosis not present

## 2014-12-23 DIAGNOSIS — Z992 Dependence on renal dialysis: Secondary | ICD-10-CM | POA: Diagnosis not present

## 2014-12-23 DIAGNOSIS — N186 End stage renal disease: Secondary | ICD-10-CM | POA: Diagnosis not present

## 2014-12-23 DIAGNOSIS — T82898A Other specified complication of vascular prosthetic devices, implants and grafts, initial encounter: Secondary | ICD-10-CM | POA: Diagnosis not present

## 2014-12-23 DIAGNOSIS — Z9181 History of falling: Secondary | ICD-10-CM | POA: Diagnosis not present

## 2014-12-23 DIAGNOSIS — Z9981 Dependence on supplemental oxygen: Secondary | ICD-10-CM | POA: Diagnosis not present

## 2014-12-23 DIAGNOSIS — J189 Pneumonia, unspecified organism: Secondary | ICD-10-CM | POA: Diagnosis not present

## 2014-12-25 DIAGNOSIS — I25119 Atherosclerotic heart disease of native coronary artery with unspecified angina pectoris: Secondary | ICD-10-CM | POA: Diagnosis not present

## 2014-12-25 DIAGNOSIS — D62 Acute posthemorrhagic anemia: Secondary | ICD-10-CM | POA: Diagnosis not present

## 2014-12-25 DIAGNOSIS — Z800 Family history of malignant neoplasm of digestive organs: Secondary | ICD-10-CM | POA: Diagnosis not present

## 2014-12-25 DIAGNOSIS — Z8673 Personal history of transient ischemic attack (TIA), and cerebral infarction without residual deficits: Secondary | ICD-10-CM | POA: Diagnosis not present

## 2014-12-25 DIAGNOSIS — K633 Ulcer of intestine: Secondary | ICD-10-CM | POA: Diagnosis not present

## 2014-12-25 DIAGNOSIS — Z8249 Family history of ischemic heart disease and other diseases of the circulatory system: Secondary | ICD-10-CM | POA: Diagnosis not present

## 2014-12-25 DIAGNOSIS — D631 Anemia in chronic kidney disease: Secondary | ICD-10-CM | POA: Diagnosis not present

## 2014-12-25 DIAGNOSIS — I509 Heart failure, unspecified: Secondary | ICD-10-CM | POA: Diagnosis not present

## 2014-12-25 DIAGNOSIS — H9190 Unspecified hearing loss, unspecified ear: Secondary | ICD-10-CM | POA: Diagnosis not present

## 2014-12-25 DIAGNOSIS — I959 Hypotension, unspecified: Secondary | ICD-10-CM | POA: Diagnosis not present

## 2014-12-25 DIAGNOSIS — Z992 Dependence on renal dialysis: Secondary | ICD-10-CM | POA: Diagnosis not present

## 2014-12-25 DIAGNOSIS — M898X9 Other specified disorders of bone, unspecified site: Secondary | ICD-10-CM | POA: Diagnosis not present

## 2014-12-25 DIAGNOSIS — I482 Chronic atrial fibrillation: Secondary | ICD-10-CM | POA: Diagnosis not present

## 2014-12-25 DIAGNOSIS — D559 Anemia due to enzyme disorder, unspecified: Secondary | ICD-10-CM | POA: Diagnosis not present

## 2014-12-25 DIAGNOSIS — D125 Benign neoplasm of sigmoid colon: Secondary | ICD-10-CM | POA: Diagnosis not present

## 2014-12-25 DIAGNOSIS — I120 Hypertensive chronic kidney disease with stage 5 chronic kidney disease or end stage renal disease: Secondary | ICD-10-CM | POA: Diagnosis not present

## 2014-12-25 DIAGNOSIS — Z9981 Dependence on supplemental oxygen: Secondary | ICD-10-CM | POA: Diagnosis not present

## 2014-12-25 DIAGNOSIS — M549 Dorsalgia, unspecified: Secondary | ICD-10-CM | POA: Diagnosis not present

## 2014-12-25 DIAGNOSIS — K922 Gastrointestinal hemorrhage, unspecified: Secondary | ICD-10-CM | POA: Diagnosis not present

## 2014-12-25 DIAGNOSIS — J449 Chronic obstructive pulmonary disease, unspecified: Secondary | ICD-10-CM | POA: Diagnosis not present

## 2014-12-25 DIAGNOSIS — Z7401 Bed confinement status: Secondary | ICD-10-CM | POA: Diagnosis not present

## 2014-12-25 DIAGNOSIS — K625 Hemorrhage of anus and rectum: Secondary | ICD-10-CM | POA: Diagnosis not present

## 2014-12-25 DIAGNOSIS — I4891 Unspecified atrial fibrillation: Secondary | ICD-10-CM | POA: Diagnosis not present

## 2014-12-25 DIAGNOSIS — G464 Cerebellar stroke syndrome: Secondary | ICD-10-CM | POA: Diagnosis not present

## 2014-12-25 DIAGNOSIS — Z833 Family history of diabetes mellitus: Secondary | ICD-10-CM | POA: Diagnosis not present

## 2014-12-25 DIAGNOSIS — C61 Malignant neoplasm of prostate: Secondary | ICD-10-CM | POA: Diagnosis not present

## 2014-12-25 DIAGNOSIS — K558 Other vascular disorders of intestine: Secondary | ICD-10-CM | POA: Diagnosis not present

## 2014-12-25 DIAGNOSIS — N186 End stage renal disease: Secondary | ICD-10-CM | POA: Diagnosis not present

## 2014-12-25 DIAGNOSIS — E780 Pure hypercholesterolemia: Secondary | ICD-10-CM | POA: Diagnosis not present

## 2014-12-25 DIAGNOSIS — M816 Localized osteoporosis [Lequesne]: Secondary | ICD-10-CM | POA: Diagnosis not present

## 2014-12-25 DIAGNOSIS — F17200 Nicotine dependence, unspecified, uncomplicated: Secondary | ICD-10-CM | POA: Diagnosis not present

## 2014-12-25 DIAGNOSIS — E785 Hyperlipidemia, unspecified: Secondary | ICD-10-CM | POA: Diagnosis not present

## 2014-12-25 DIAGNOSIS — K921 Melena: Secondary | ICD-10-CM | POA: Diagnosis not present

## 2014-12-25 DIAGNOSIS — I5032 Chronic diastolic (congestive) heart failure: Secondary | ICD-10-CM | POA: Diagnosis not present

## 2014-12-25 DIAGNOSIS — I2510 Atherosclerotic heart disease of native coronary artery without angina pectoris: Secondary | ICD-10-CM | POA: Diagnosis not present

## 2014-12-25 DIAGNOSIS — K219 Gastro-esophageal reflux disease without esophagitis: Secondary | ICD-10-CM | POA: Diagnosis not present

## 2014-12-25 DIAGNOSIS — R000 Tachycardia, unspecified: Secondary | ICD-10-CM | POA: Diagnosis not present

## 2014-12-25 DIAGNOSIS — D126 Benign neoplasm of colon, unspecified: Secondary | ICD-10-CM | POA: Diagnosis not present

## 2014-12-30 DIAGNOSIS — D126 Benign neoplasm of colon, unspecified: Secondary | ICD-10-CM | POA: Diagnosis not present

## 2014-12-31 DIAGNOSIS — I959 Hypotension, unspecified: Secondary | ICD-10-CM | POA: Diagnosis not present

## 2014-12-31 DIAGNOSIS — T82898A Other specified complication of vascular prosthetic devices, implants and grafts, initial encounter: Secondary | ICD-10-CM | POA: Diagnosis not present

## 2014-12-31 DIAGNOSIS — E785 Hyperlipidemia, unspecified: Secondary | ICD-10-CM | POA: Diagnosis not present

## 2014-12-31 DIAGNOSIS — K558 Other vascular disorders of intestine: Secondary | ICD-10-CM | POA: Diagnosis not present

## 2014-12-31 DIAGNOSIS — K922 Gastrointestinal hemorrhage, unspecified: Secondary | ICD-10-CM | POA: Diagnosis not present

## 2014-12-31 DIAGNOSIS — Z7401 Bed confinement status: Secondary | ICD-10-CM | POA: Diagnosis not present

## 2014-12-31 DIAGNOSIS — M816 Localized osteoporosis [Lequesne]: Secondary | ICD-10-CM | POA: Diagnosis not present

## 2014-12-31 DIAGNOSIS — K625 Hemorrhage of anus and rectum: Secondary | ICD-10-CM | POA: Diagnosis not present

## 2014-12-31 DIAGNOSIS — Z23 Encounter for immunization: Secondary | ICD-10-CM | POA: Diagnosis not present

## 2014-12-31 DIAGNOSIS — D631 Anemia in chronic kidney disease: Secondary | ICD-10-CM | POA: Diagnosis not present

## 2014-12-31 DIAGNOSIS — Z992 Dependence on renal dialysis: Secondary | ICD-10-CM | POA: Diagnosis not present

## 2014-12-31 DIAGNOSIS — G464 Cerebellar stroke syndrome: Secondary | ICD-10-CM | POA: Diagnosis not present

## 2014-12-31 DIAGNOSIS — I482 Chronic atrial fibrillation: Secondary | ICD-10-CM | POA: Diagnosis not present

## 2014-12-31 DIAGNOSIS — N186 End stage renal disease: Secondary | ICD-10-CM | POA: Diagnosis not present

## 2014-12-31 DIAGNOSIS — J449 Chronic obstructive pulmonary disease, unspecified: Secondary | ICD-10-CM | POA: Diagnosis not present

## 2014-12-31 DIAGNOSIS — Z9981 Dependence on supplemental oxygen: Secondary | ICD-10-CM | POA: Diagnosis not present

## 2014-12-31 DIAGNOSIS — D559 Anemia due to enzyme disorder, unspecified: Secondary | ICD-10-CM | POA: Diagnosis not present

## 2014-12-31 DIAGNOSIS — I25119 Atherosclerotic heart disease of native coronary artery with unspecified angina pectoris: Secondary | ICD-10-CM | POA: Diagnosis not present

## 2014-12-31 DIAGNOSIS — M549 Dorsalgia, unspecified: Secondary | ICD-10-CM | POA: Diagnosis not present

## 2015-01-02 DIAGNOSIS — T82898A Other specified complication of vascular prosthetic devices, implants and grafts, initial encounter: Secondary | ICD-10-CM | POA: Diagnosis not present

## 2015-01-02 DIAGNOSIS — Z992 Dependence on renal dialysis: Secondary | ICD-10-CM | POA: Diagnosis not present

## 2015-01-02 DIAGNOSIS — Z23 Encounter for immunization: Secondary | ICD-10-CM | POA: Diagnosis not present

## 2015-01-02 DIAGNOSIS — D631 Anemia in chronic kidney disease: Secondary | ICD-10-CM | POA: Diagnosis not present

## 2015-01-02 DIAGNOSIS — N186 End stage renal disease: Secondary | ICD-10-CM | POA: Diagnosis not present

## 2015-01-03 DIAGNOSIS — K921 Melena: Secondary | ICD-10-CM | POA: Diagnosis not present

## 2015-01-03 DIAGNOSIS — M549 Dorsalgia, unspecified: Secondary | ICD-10-CM | POA: Diagnosis not present

## 2015-01-03 DIAGNOSIS — Z79899 Other long term (current) drug therapy: Secondary | ICD-10-CM | POA: Diagnosis not present

## 2015-01-03 DIAGNOSIS — R404 Transient alteration of awareness: Secondary | ICD-10-CM | POA: Diagnosis not present

## 2015-01-03 DIAGNOSIS — Z7401 Bed confinement status: Secondary | ICD-10-CM | POA: Diagnosis not present

## 2015-01-03 DIAGNOSIS — C61 Malignant neoplasm of prostate: Secondary | ICD-10-CM | POA: Diagnosis not present

## 2015-01-03 DIAGNOSIS — I25119 Atherosclerotic heart disease of native coronary artery with unspecified angina pectoris: Secondary | ICD-10-CM | POA: Diagnosis not present

## 2015-01-03 DIAGNOSIS — Z992 Dependence on renal dialysis: Secondary | ICD-10-CM | POA: Diagnosis not present

## 2015-01-03 DIAGNOSIS — N186 End stage renal disease: Secondary | ICD-10-CM | POA: Diagnosis not present

## 2015-01-03 DIAGNOSIS — I959 Hypotension, unspecified: Secondary | ICD-10-CM | POA: Diagnosis not present

## 2015-01-03 DIAGNOSIS — J449 Chronic obstructive pulmonary disease, unspecified: Secondary | ICD-10-CM | POA: Diagnosis not present

## 2015-01-03 DIAGNOSIS — Z7901 Long term (current) use of anticoagulants: Secondary | ICD-10-CM | POA: Diagnosis not present

## 2015-01-03 DIAGNOSIS — K922 Gastrointestinal hemorrhage, unspecified: Secondary | ICD-10-CM | POA: Diagnosis not present

## 2015-01-03 DIAGNOSIS — M545 Low back pain: Secondary | ICD-10-CM | POA: Diagnosis not present

## 2015-01-03 DIAGNOSIS — Z66 Do not resuscitate: Secondary | ICD-10-CM | POA: Diagnosis not present

## 2015-01-03 DIAGNOSIS — Z8673 Personal history of transient ischemic attack (TIA), and cerebral infarction without residual deficits: Secondary | ICD-10-CM | POA: Diagnosis not present

## 2015-01-03 DIAGNOSIS — R279 Unspecified lack of coordination: Secondary | ICD-10-CM | POA: Diagnosis not present

## 2015-01-03 DIAGNOSIS — I482 Chronic atrial fibrillation: Secondary | ICD-10-CM | POA: Diagnosis not present

## 2015-01-03 DIAGNOSIS — G464 Cerebellar stroke syndrome: Secondary | ICD-10-CM | POA: Diagnosis not present

## 2015-01-03 DIAGNOSIS — M816 Localized osteoporosis [Lequesne]: Secondary | ICD-10-CM | POA: Diagnosis not present

## 2015-01-03 DIAGNOSIS — D559 Anemia due to enzyme disorder, unspecified: Secondary | ICD-10-CM | POA: Diagnosis not present

## 2015-01-03 DIAGNOSIS — R531 Weakness: Secondary | ICD-10-CM | POA: Diagnosis not present

## 2015-01-05 DIAGNOSIS — M549 Dorsalgia, unspecified: Secondary | ICD-10-CM | POA: Diagnosis not present

## 2015-01-05 DIAGNOSIS — D649 Anemia, unspecified: Secondary | ICD-10-CM | POA: Diagnosis not present

## 2015-01-05 DIAGNOSIS — R279 Unspecified lack of coordination: Secondary | ICD-10-CM | POA: Diagnosis not present

## 2015-01-05 DIAGNOSIS — Z992 Dependence on renal dialysis: Secondary | ICD-10-CM | POA: Diagnosis not present

## 2015-01-05 DIAGNOSIS — D559 Anemia due to enzyme disorder, unspecified: Secondary | ICD-10-CM | POA: Diagnosis not present

## 2015-01-05 DIAGNOSIS — Z23 Encounter for immunization: Secondary | ICD-10-CM | POA: Diagnosis not present

## 2015-01-05 DIAGNOSIS — T82898A Other specified complication of vascular prosthetic devices, implants and grafts, initial encounter: Secondary | ICD-10-CM | POA: Diagnosis not present

## 2015-01-05 DIAGNOSIS — M816 Localized osteoporosis [Lequesne]: Secondary | ICD-10-CM | POA: Diagnosis not present

## 2015-01-05 DIAGNOSIS — M545 Low back pain: Secondary | ICD-10-CM | POA: Diagnosis not present

## 2015-01-05 DIAGNOSIS — I25119 Atherosclerotic heart disease of native coronary artery with unspecified angina pectoris: Secondary | ICD-10-CM | POA: Diagnosis not present

## 2015-01-05 DIAGNOSIS — I482 Chronic atrial fibrillation: Secondary | ICD-10-CM | POA: Diagnosis not present

## 2015-01-05 DIAGNOSIS — Z7401 Bed confinement status: Secondary | ICD-10-CM | POA: Diagnosis not present

## 2015-01-05 DIAGNOSIS — K922 Gastrointestinal hemorrhage, unspecified: Secondary | ICD-10-CM | POA: Diagnosis not present

## 2015-01-05 DIAGNOSIS — J449 Chronic obstructive pulmonary disease, unspecified: Secondary | ICD-10-CM | POA: Diagnosis not present

## 2015-01-05 DIAGNOSIS — D631 Anemia in chronic kidney disease: Secondary | ICD-10-CM | POA: Diagnosis not present

## 2015-01-05 DIAGNOSIS — G464 Cerebellar stroke syndrome: Secondary | ICD-10-CM | POA: Diagnosis not present

## 2015-01-05 DIAGNOSIS — D509 Iron deficiency anemia, unspecified: Secondary | ICD-10-CM | POA: Diagnosis not present

## 2015-01-05 DIAGNOSIS — I959 Hypotension, unspecified: Secondary | ICD-10-CM | POA: Diagnosis not present

## 2015-01-05 DIAGNOSIS — N186 End stage renal disease: Secondary | ICD-10-CM | POA: Diagnosis not present

## 2015-01-06 DIAGNOSIS — T82898A Other specified complication of vascular prosthetic devices, implants and grafts, initial encounter: Secondary | ICD-10-CM | POA: Diagnosis not present

## 2015-01-06 DIAGNOSIS — Z23 Encounter for immunization: Secondary | ICD-10-CM | POA: Diagnosis not present

## 2015-01-06 DIAGNOSIS — D631 Anemia in chronic kidney disease: Secondary | ICD-10-CM | POA: Diagnosis not present

## 2015-01-06 DIAGNOSIS — Z992 Dependence on renal dialysis: Secondary | ICD-10-CM | POA: Diagnosis not present

## 2015-01-06 DIAGNOSIS — N186 End stage renal disease: Secondary | ICD-10-CM | POA: Diagnosis not present

## 2015-01-09 DIAGNOSIS — D509 Iron deficiency anemia, unspecified: Secondary | ICD-10-CM | POA: Diagnosis not present

## 2015-01-09 DIAGNOSIS — D631 Anemia in chronic kidney disease: Secondary | ICD-10-CM | POA: Diagnosis not present

## 2015-01-09 DIAGNOSIS — Z992 Dependence on renal dialysis: Secondary | ICD-10-CM | POA: Diagnosis not present

## 2015-01-09 DIAGNOSIS — N186 End stage renal disease: Secondary | ICD-10-CM | POA: Diagnosis not present

## 2015-01-10 DIAGNOSIS — K922 Gastrointestinal hemorrhage, unspecified: Secondary | ICD-10-CM | POA: Diagnosis not present

## 2015-01-10 DIAGNOSIS — D649 Anemia, unspecified: Secondary | ICD-10-CM | POA: Diagnosis not present

## 2015-01-11 DIAGNOSIS — D509 Iron deficiency anemia, unspecified: Secondary | ICD-10-CM | POA: Diagnosis not present

## 2015-01-11 DIAGNOSIS — D631 Anemia in chronic kidney disease: Secondary | ICD-10-CM | POA: Diagnosis not present

## 2015-01-11 DIAGNOSIS — Z992 Dependence on renal dialysis: Secondary | ICD-10-CM | POA: Diagnosis not present

## 2015-01-11 DIAGNOSIS — N186 End stage renal disease: Secondary | ICD-10-CM | POA: Diagnosis not present

## 2015-01-13 DIAGNOSIS — Z992 Dependence on renal dialysis: Secondary | ICD-10-CM | POA: Diagnosis not present

## 2015-01-13 DIAGNOSIS — N186 End stage renal disease: Secondary | ICD-10-CM | POA: Diagnosis not present

## 2015-01-13 DIAGNOSIS — D509 Iron deficiency anemia, unspecified: Secondary | ICD-10-CM | POA: Diagnosis not present

## 2015-01-13 DIAGNOSIS — D631 Anemia in chronic kidney disease: Secondary | ICD-10-CM | POA: Diagnosis not present

## 2015-01-16 DIAGNOSIS — D631 Anemia in chronic kidney disease: Secondary | ICD-10-CM | POA: Diagnosis not present

## 2015-01-16 DIAGNOSIS — N186 End stage renal disease: Secondary | ICD-10-CM | POA: Diagnosis not present

## 2015-01-16 DIAGNOSIS — Z992 Dependence on renal dialysis: Secondary | ICD-10-CM | POA: Diagnosis not present

## 2015-01-16 DIAGNOSIS — D509 Iron deficiency anemia, unspecified: Secondary | ICD-10-CM | POA: Diagnosis not present

## 2015-01-16 DIAGNOSIS — D649 Anemia, unspecified: Secondary | ICD-10-CM | POA: Diagnosis not present

## 2015-01-18 DIAGNOSIS — Z992 Dependence on renal dialysis: Secondary | ICD-10-CM | POA: Diagnosis not present

## 2015-01-18 DIAGNOSIS — D509 Iron deficiency anemia, unspecified: Secondary | ICD-10-CM | POA: Diagnosis not present

## 2015-01-18 DIAGNOSIS — N186 End stage renal disease: Secondary | ICD-10-CM | POA: Diagnosis not present

## 2015-01-18 DIAGNOSIS — D631 Anemia in chronic kidney disease: Secondary | ICD-10-CM | POA: Diagnosis not present

## 2015-01-20 DIAGNOSIS — D631 Anemia in chronic kidney disease: Secondary | ICD-10-CM | POA: Diagnosis not present

## 2015-01-20 DIAGNOSIS — D509 Iron deficiency anemia, unspecified: Secondary | ICD-10-CM | POA: Diagnosis not present

## 2015-01-20 DIAGNOSIS — N186 End stage renal disease: Secondary | ICD-10-CM | POA: Diagnosis not present

## 2015-01-20 DIAGNOSIS — Z992 Dependence on renal dialysis: Secondary | ICD-10-CM | POA: Diagnosis not present

## 2015-01-23 DIAGNOSIS — D631 Anemia in chronic kidney disease: Secondary | ICD-10-CM | POA: Diagnosis not present

## 2015-01-23 DIAGNOSIS — N186 End stage renal disease: Secondary | ICD-10-CM | POA: Diagnosis not present

## 2015-01-23 DIAGNOSIS — D509 Iron deficiency anemia, unspecified: Secondary | ICD-10-CM | POA: Diagnosis not present

## 2015-01-23 DIAGNOSIS — Z992 Dependence on renal dialysis: Secondary | ICD-10-CM | POA: Diagnosis not present

## 2015-01-25 DIAGNOSIS — D631 Anemia in chronic kidney disease: Secondary | ICD-10-CM | POA: Diagnosis not present

## 2015-01-25 DIAGNOSIS — N186 End stage renal disease: Secondary | ICD-10-CM | POA: Diagnosis not present

## 2015-01-25 DIAGNOSIS — D509 Iron deficiency anemia, unspecified: Secondary | ICD-10-CM | POA: Diagnosis not present

## 2015-01-25 DIAGNOSIS — Z992 Dependence on renal dialysis: Secondary | ICD-10-CM | POA: Diagnosis not present

## 2015-01-30 DIAGNOSIS — Z992 Dependence on renal dialysis: Secondary | ICD-10-CM | POA: Diagnosis not present

## 2015-01-30 DIAGNOSIS — D509 Iron deficiency anemia, unspecified: Secondary | ICD-10-CM | POA: Diagnosis not present

## 2015-01-30 DIAGNOSIS — N186 End stage renal disease: Secondary | ICD-10-CM | POA: Diagnosis not present

## 2015-01-30 DIAGNOSIS — D631 Anemia in chronic kidney disease: Secondary | ICD-10-CM | POA: Diagnosis not present

## 2015-02-01 DIAGNOSIS — N186 End stage renal disease: Secondary | ICD-10-CM | POA: Diagnosis not present

## 2015-02-01 DIAGNOSIS — D509 Iron deficiency anemia, unspecified: Secondary | ICD-10-CM | POA: Diagnosis not present

## 2015-02-01 DIAGNOSIS — Z992 Dependence on renal dialysis: Secondary | ICD-10-CM | POA: Diagnosis not present

## 2015-02-01 DIAGNOSIS — D631 Anemia in chronic kidney disease: Secondary | ICD-10-CM | POA: Diagnosis not present

## 2015-02-03 DIAGNOSIS — Z992 Dependence on renal dialysis: Secondary | ICD-10-CM | POA: Diagnosis not present

## 2015-02-03 DIAGNOSIS — N186 End stage renal disease: Secondary | ICD-10-CM | POA: Diagnosis not present

## 2015-02-03 DIAGNOSIS — D631 Anemia in chronic kidney disease: Secondary | ICD-10-CM | POA: Diagnosis not present

## 2015-02-03 DIAGNOSIS — D509 Iron deficiency anemia, unspecified: Secondary | ICD-10-CM | POA: Diagnosis not present

## 2015-02-06 DIAGNOSIS — N186 End stage renal disease: Secondary | ICD-10-CM | POA: Diagnosis not present

## 2015-02-06 DIAGNOSIS — D509 Iron deficiency anemia, unspecified: Secondary | ICD-10-CM | POA: Diagnosis not present

## 2015-02-06 DIAGNOSIS — Z992 Dependence on renal dialysis: Secondary | ICD-10-CM | POA: Diagnosis not present

## 2015-02-06 DIAGNOSIS — D631 Anemia in chronic kidney disease: Secondary | ICD-10-CM | POA: Diagnosis not present

## 2015-02-08 DIAGNOSIS — D509 Iron deficiency anemia, unspecified: Secondary | ICD-10-CM | POA: Diagnosis not present

## 2015-02-08 DIAGNOSIS — Z992 Dependence on renal dialysis: Secondary | ICD-10-CM | POA: Diagnosis not present

## 2015-02-08 DIAGNOSIS — D631 Anemia in chronic kidney disease: Secondary | ICD-10-CM | POA: Diagnosis not present

## 2015-02-08 DIAGNOSIS — N186 End stage renal disease: Secondary | ICD-10-CM | POA: Diagnosis not present

## 2015-02-10 DIAGNOSIS — D509 Iron deficiency anemia, unspecified: Secondary | ICD-10-CM | POA: Diagnosis not present

## 2015-02-10 DIAGNOSIS — D631 Anemia in chronic kidney disease: Secondary | ICD-10-CM | POA: Diagnosis not present

## 2015-02-10 DIAGNOSIS — Z992 Dependence on renal dialysis: Secondary | ICD-10-CM | POA: Diagnosis not present

## 2015-02-10 DIAGNOSIS — N186 End stage renal disease: Secondary | ICD-10-CM | POA: Diagnosis not present

## 2015-02-13 DIAGNOSIS — Z992 Dependence on renal dialysis: Secondary | ICD-10-CM | POA: Diagnosis not present

## 2015-02-13 DIAGNOSIS — D509 Iron deficiency anemia, unspecified: Secondary | ICD-10-CM | POA: Diagnosis not present

## 2015-02-13 DIAGNOSIS — N186 End stage renal disease: Secondary | ICD-10-CM | POA: Diagnosis not present

## 2015-02-13 DIAGNOSIS — D631 Anemia in chronic kidney disease: Secondary | ICD-10-CM | POA: Diagnosis not present

## 2015-02-15 DIAGNOSIS — D631 Anemia in chronic kidney disease: Secondary | ICD-10-CM | POA: Diagnosis not present

## 2015-02-15 DIAGNOSIS — N186 End stage renal disease: Secondary | ICD-10-CM | POA: Diagnosis not present

## 2015-02-15 DIAGNOSIS — D509 Iron deficiency anemia, unspecified: Secondary | ICD-10-CM | POA: Diagnosis not present

## 2015-02-15 DIAGNOSIS — Z992 Dependence on renal dialysis: Secondary | ICD-10-CM | POA: Diagnosis not present

## 2015-02-17 DIAGNOSIS — D509 Iron deficiency anemia, unspecified: Secondary | ICD-10-CM | POA: Diagnosis not present

## 2015-02-17 DIAGNOSIS — Z992 Dependence on renal dialysis: Secondary | ICD-10-CM | POA: Diagnosis not present

## 2015-02-17 DIAGNOSIS — N186 End stage renal disease: Secondary | ICD-10-CM | POA: Diagnosis not present

## 2015-02-17 DIAGNOSIS — D631 Anemia in chronic kidney disease: Secondary | ICD-10-CM | POA: Diagnosis not present

## 2015-02-19 DIAGNOSIS — M545 Low back pain: Secondary | ICD-10-CM | POA: Diagnosis not present

## 2015-02-20 DIAGNOSIS — Z992 Dependence on renal dialysis: Secondary | ICD-10-CM | POA: Diagnosis not present

## 2015-02-20 DIAGNOSIS — N186 End stage renal disease: Secondary | ICD-10-CM | POA: Diagnosis not present

## 2015-02-20 DIAGNOSIS — D509 Iron deficiency anemia, unspecified: Secondary | ICD-10-CM | POA: Diagnosis not present

## 2015-02-20 DIAGNOSIS — D631 Anemia in chronic kidney disease: Secondary | ICD-10-CM | POA: Diagnosis not present

## 2015-02-22 DIAGNOSIS — D631 Anemia in chronic kidney disease: Secondary | ICD-10-CM | POA: Diagnosis not present

## 2015-02-22 DIAGNOSIS — Z992 Dependence on renal dialysis: Secondary | ICD-10-CM | POA: Diagnosis not present

## 2015-02-22 DIAGNOSIS — N186 End stage renal disease: Secondary | ICD-10-CM | POA: Diagnosis not present

## 2015-02-22 DIAGNOSIS — D509 Iron deficiency anemia, unspecified: Secondary | ICD-10-CM | POA: Diagnosis not present

## 2015-02-24 DIAGNOSIS — N186 End stage renal disease: Secondary | ICD-10-CM | POA: Diagnosis not present

## 2015-02-24 DIAGNOSIS — D509 Iron deficiency anemia, unspecified: Secondary | ICD-10-CM | POA: Diagnosis not present

## 2015-02-24 DIAGNOSIS — Z992 Dependence on renal dialysis: Secondary | ICD-10-CM | POA: Diagnosis not present

## 2015-02-24 DIAGNOSIS — D631 Anemia in chronic kidney disease: Secondary | ICD-10-CM | POA: Diagnosis not present

## 2015-02-27 DIAGNOSIS — D509 Iron deficiency anemia, unspecified: Secondary | ICD-10-CM | POA: Diagnosis not present

## 2015-02-27 DIAGNOSIS — N186 End stage renal disease: Secondary | ICD-10-CM | POA: Diagnosis not present

## 2015-02-27 DIAGNOSIS — Z992 Dependence on renal dialysis: Secondary | ICD-10-CM | POA: Diagnosis not present

## 2015-02-27 DIAGNOSIS — D631 Anemia in chronic kidney disease: Secondary | ICD-10-CM | POA: Diagnosis not present

## 2015-03-01 DIAGNOSIS — N186 End stage renal disease: Secondary | ICD-10-CM | POA: Diagnosis not present

## 2015-03-01 DIAGNOSIS — D631 Anemia in chronic kidney disease: Secondary | ICD-10-CM | POA: Diagnosis not present

## 2015-03-01 DIAGNOSIS — D509 Iron deficiency anemia, unspecified: Secondary | ICD-10-CM | POA: Diagnosis not present

## 2015-03-01 DIAGNOSIS — Z992 Dependence on renal dialysis: Secondary | ICD-10-CM | POA: Diagnosis not present

## 2015-03-03 DIAGNOSIS — D631 Anemia in chronic kidney disease: Secondary | ICD-10-CM | POA: Diagnosis not present

## 2015-03-03 DIAGNOSIS — N186 End stage renal disease: Secondary | ICD-10-CM | POA: Diagnosis not present

## 2015-03-03 DIAGNOSIS — D509 Iron deficiency anemia, unspecified: Secondary | ICD-10-CM | POA: Diagnosis not present

## 2015-03-03 DIAGNOSIS — Z992 Dependence on renal dialysis: Secondary | ICD-10-CM | POA: Diagnosis not present

## 2015-03-06 DIAGNOSIS — R4182 Altered mental status, unspecified: Secondary | ICD-10-CM | POA: Diagnosis not present

## 2015-03-06 DIAGNOSIS — I2510 Atherosclerotic heart disease of native coronary artery without angina pectoris: Secondary | ICD-10-CM | POA: Diagnosis not present

## 2015-03-06 DIAGNOSIS — I959 Hypotension, unspecified: Secondary | ICD-10-CM | POA: Diagnosis not present

## 2015-03-06 DIAGNOSIS — N186 End stage renal disease: Secondary | ICD-10-CM | POA: Diagnosis not present

## 2015-03-06 DIAGNOSIS — F17201 Nicotine dependence, unspecified, in remission: Secondary | ICD-10-CM | POA: Diagnosis not present

## 2015-03-06 DIAGNOSIS — Z809 Family history of malignant neoplasm, unspecified: Secondary | ICD-10-CM | POA: Diagnosis not present

## 2015-03-06 DIAGNOSIS — E860 Dehydration: Secondary | ICD-10-CM | POA: Diagnosis not present

## 2015-03-06 DIAGNOSIS — L89152 Pressure ulcer of sacral region, stage 2: Secondary | ICD-10-CM | POA: Diagnosis not present

## 2015-03-06 DIAGNOSIS — M5489 Other dorsalgia: Secondary | ICD-10-CM | POA: Diagnosis not present

## 2015-03-06 DIAGNOSIS — Z515 Encounter for palliative care: Secondary | ICD-10-CM | POA: Diagnosis not present

## 2015-03-06 DIAGNOSIS — R001 Bradycardia, unspecified: Secondary | ICD-10-CM | POA: Diagnosis not present

## 2015-03-06 DIAGNOSIS — J449 Chronic obstructive pulmonary disease, unspecified: Secondary | ICD-10-CM | POA: Diagnosis not present

## 2015-03-06 DIAGNOSIS — R402431 Glasgow coma scale score 3-8, in the field [EMT or ambulance]: Secondary | ICD-10-CM | POA: Diagnosis not present

## 2015-03-06 DIAGNOSIS — Z8673 Personal history of transient ischemic attack (TIA), and cerebral infarction without residual deficits: Secondary | ICD-10-CM | POA: Diagnosis not present

## 2015-03-06 DIAGNOSIS — I482 Chronic atrial fibrillation: Secondary | ICD-10-CM | POA: Diagnosis not present

## 2015-03-06 DIAGNOSIS — L8992 Pressure ulcer of unspecified site, stage 2: Secondary | ICD-10-CM | POA: Diagnosis not present

## 2015-03-06 DIAGNOSIS — C61 Malignant neoplasm of prostate: Secondary | ICD-10-CM | POA: Diagnosis not present

## 2015-03-06 DIAGNOSIS — Z833 Family history of diabetes mellitus: Secondary | ICD-10-CM | POA: Diagnosis not present

## 2015-03-06 DIAGNOSIS — Z992 Dependence on renal dialysis: Secondary | ICD-10-CM | POA: Diagnosis not present

## 2015-03-06 DIAGNOSIS — Z8249 Family history of ischemic heart disease and other diseases of the circulatory system: Secondary | ICD-10-CM | POA: Diagnosis not present

## 2015-03-07 DIAGNOSIS — I953 Hypotension of hemodialysis: Secondary | ICD-10-CM | POA: Diagnosis not present

## 2015-03-07 DIAGNOSIS — Z7401 Bed confinement status: Secondary | ICD-10-CM | POA: Diagnosis not present

## 2015-03-07 DIAGNOSIS — Z9981 Dependence on supplemental oxygen: Secondary | ICD-10-CM | POA: Diagnosis not present

## 2015-03-08 DIAGNOSIS — Z992 Dependence on renal dialysis: Secondary | ICD-10-CM | POA: Diagnosis not present

## 2015-03-08 DIAGNOSIS — N186 End stage renal disease: Secondary | ICD-10-CM | POA: Diagnosis not present

## 2015-04-09 DEATH — deceased

## 2015-05-08 IMAGING — CT CT ABD-PELV W/O CM
2 of 4 series · 15 of 46 positions shown, 17 images · non-contrast
Comparison: CT abdomen and pelvis 09/21/2013.

CLINICAL DATA: Blood in stool.  Lower abdominal pain.  Nausea.

EXAM:
CT ABDOMEN AND PELVIS WITHOUT CONTRAST
TECHNIQUE: Multidetector CT imaging of the abdomen and pelvis was performed
following the standard protocol without IV contrast.

[Series 2: abd/ pelvis 5.0 i30f 1 · axial · 0.88mm/px · z∈[+548,+1068]mm · 12 of 114 slices shown, 14 images]
[im 5/114  soft-tissue]
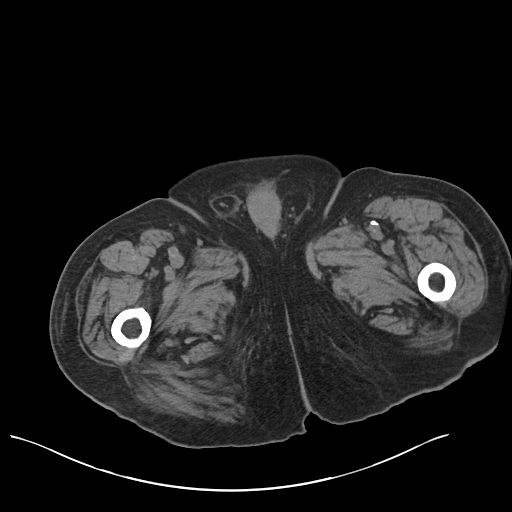
[im 5/114  bone]
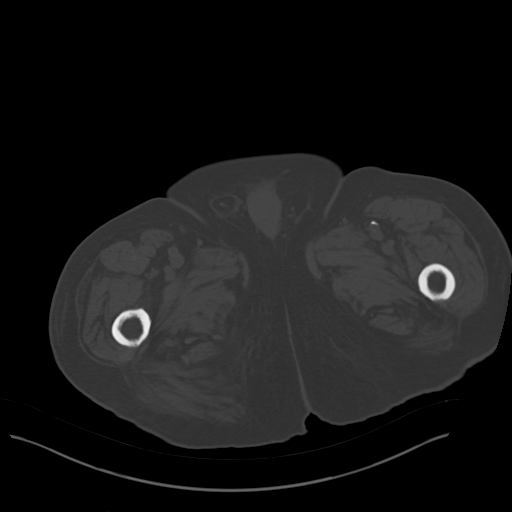
[im 15/114  soft-tissue]
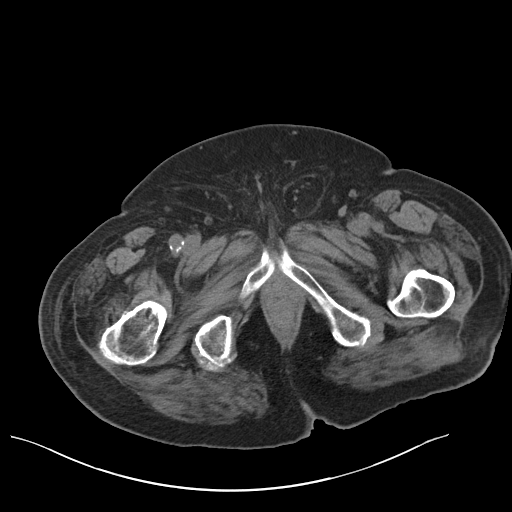
[im 25/114  soft-tissue]
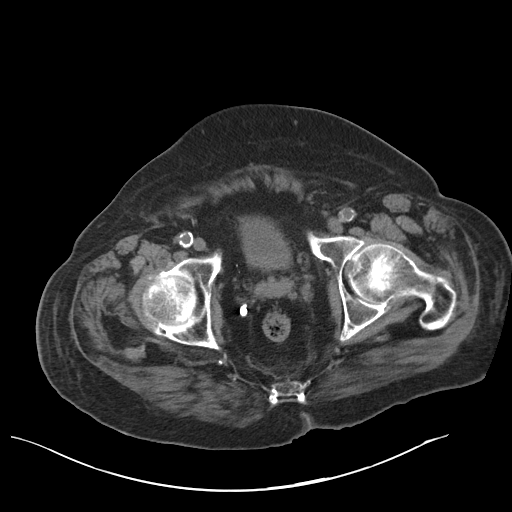
[im 35/114  soft-tissue]
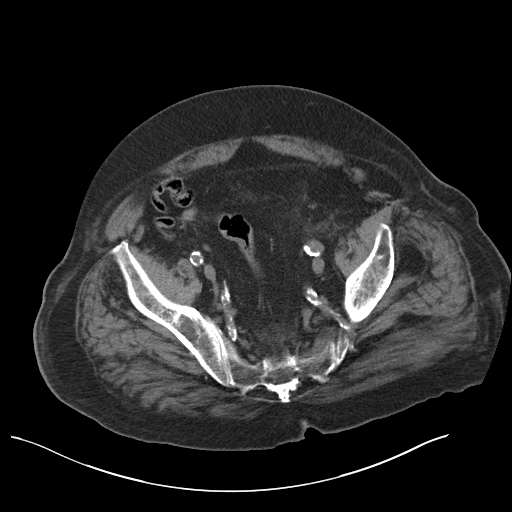
[im 45/114  soft-tissue]
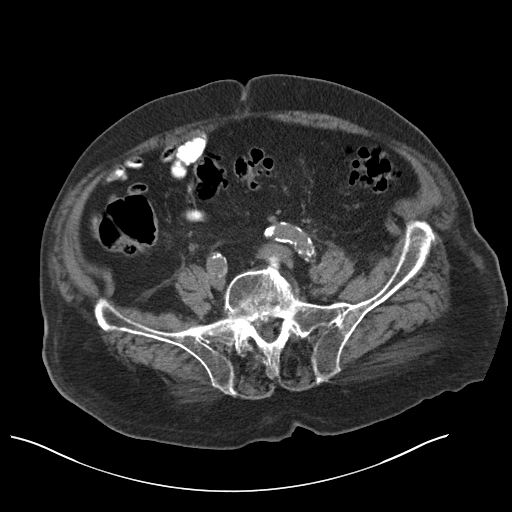
[im 55/114  soft-tissue]
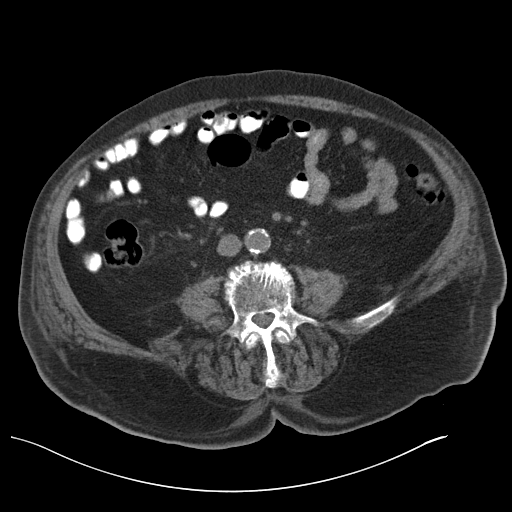
[im 59/114  soft-tissue]
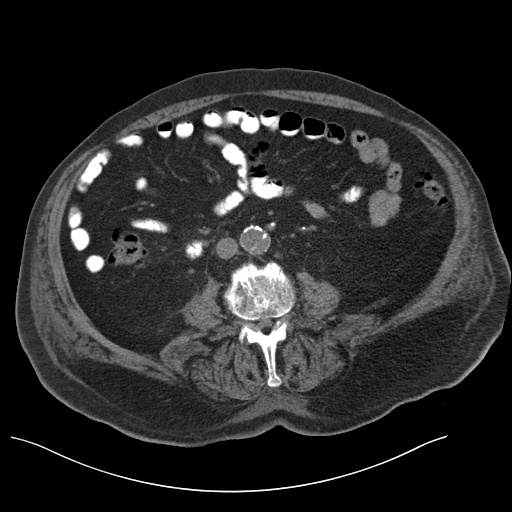
[im 69/114  soft-tissue]
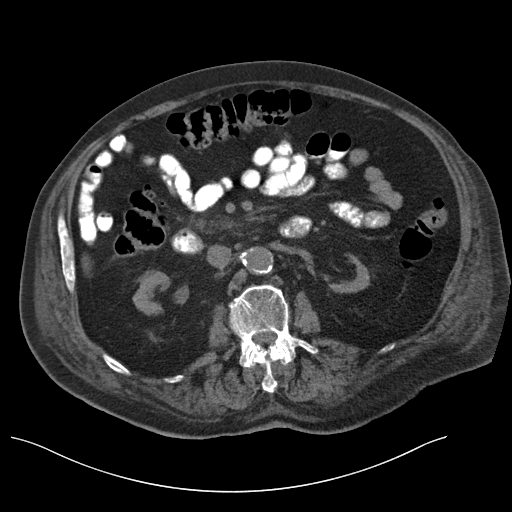
[im 79/114  soft-tissue]
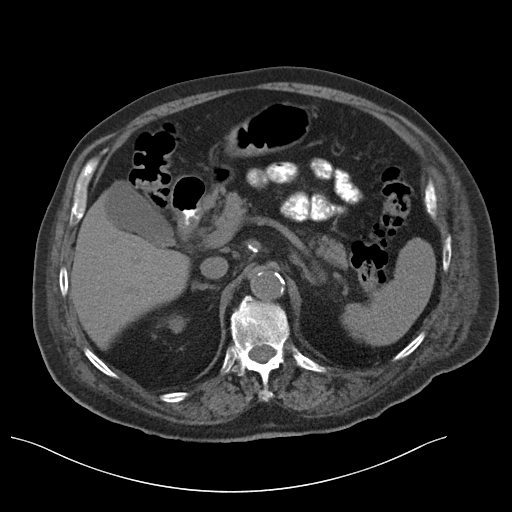
[im 79/114  bone]
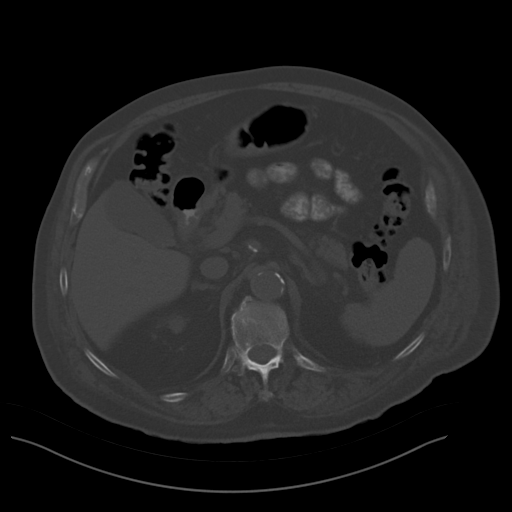
[im 89/114  soft-tissue]
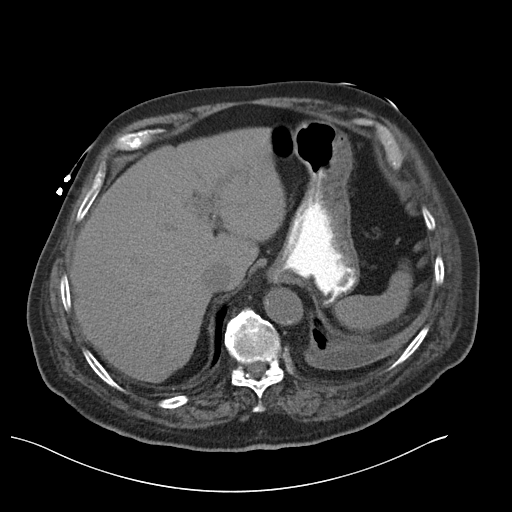
[im 99/114  soft-tissue]
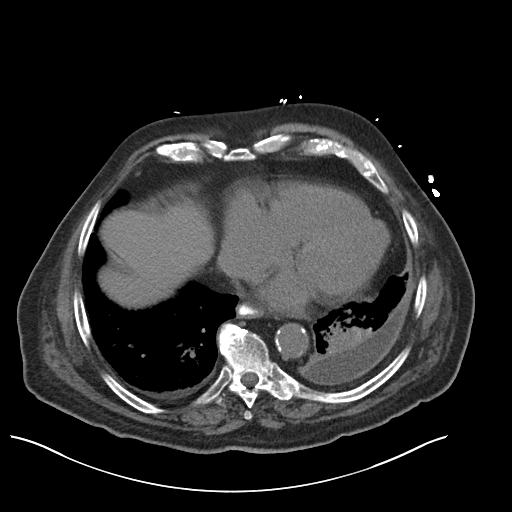
[im 109/114  soft-tissue]
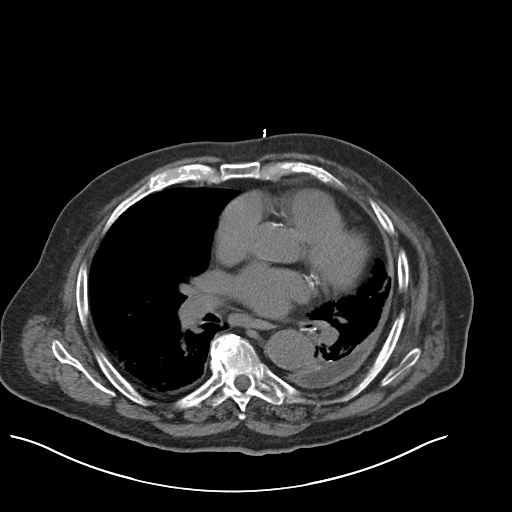

[Series 5: coronals · coronal · 0.87mm/px · 3 of 158 slices shown]
[im 53/158  soft-tissue]
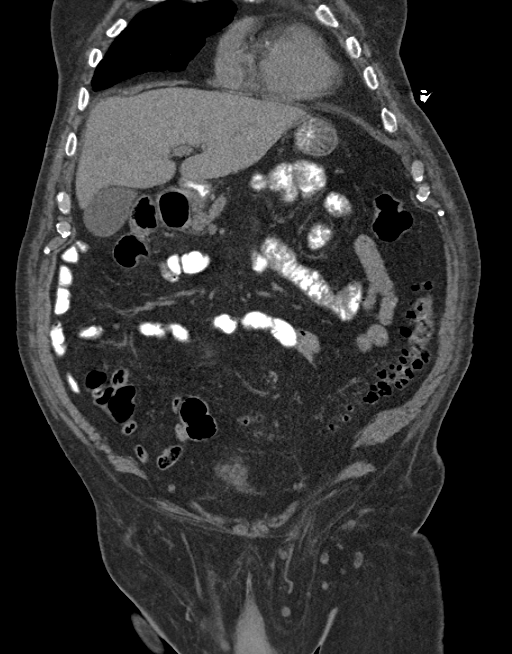
[im 70/158  soft-tissue]
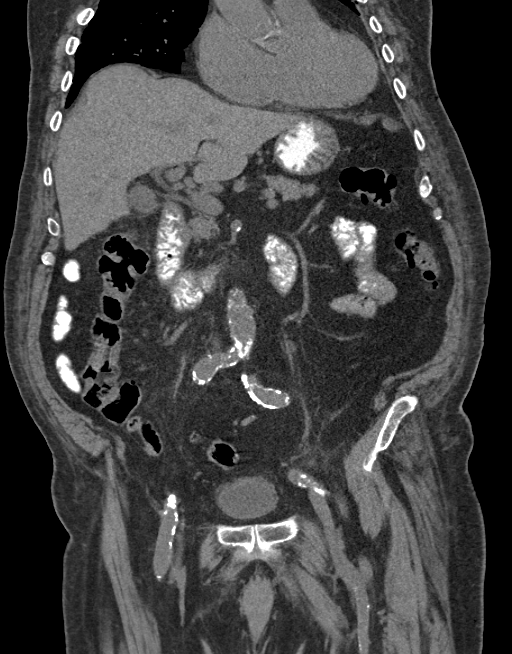
[im 88/158  soft-tissue]
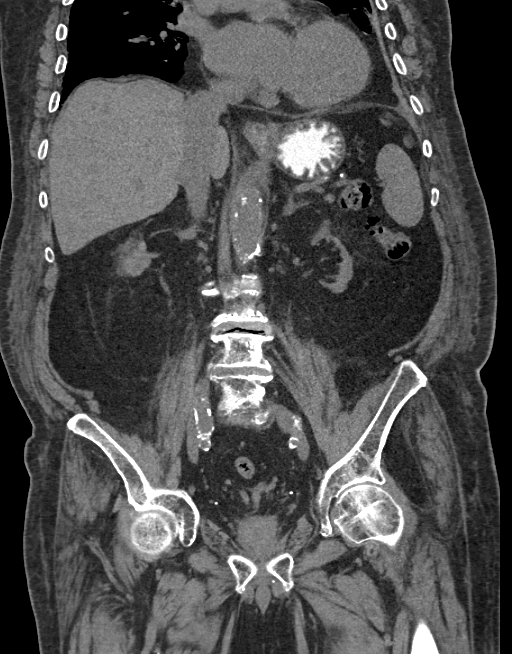

[15 of 46 positions shown; findings below may reference images not displayed]

FINDINGS: Small left pleural effusion is again seen with associated pleural
thickening. The appearance is not markedly changed. Associated left
basilar airspace opacity is also unchanged. Dependent atelectasis in
the right base is noted. There is cardiomegaly. Calcific coronary
artery disease is identified. Small amount of oral contrast is seen
in the distal esophagus consistent with either reflux or esophageal
dysmotility.

A punctate calcification in the inferior right hepatic lobe is
unchanged. The gallbladder, adrenal glands, spleen, pancreas and
biliary tree are unremarkable. The kidneys are markedly atrophic
consistent with chronic renal failure. Small hyper attenuating
lesions in the lower pole of the right kidney are unchanged and
likely represent cysts. Extensive aortoiliac atherosclerosis is
identified with unchanged ectasia of the descending abdominal aorta
at 2.6 cm in diameter. Bilateral common iliac artery aneurysms
measuring 1.6 cm on the left and 1.7 cm on the right are unchanged.
There is no hemorrhage.

The patient has extensive diverticular disease. There is new mild
stranding of fat in both the right and left lower quadrants of the
abdomen. No focal fluid collection is identified. There is no
evidence of bowel ischemia. The stomach and small bowel are
unremarkable. No lymphadenopathy is seen. Back containing inguinal
hernia is are unchanged. No lytic or sclerotic bony lesion is
identified with multilevel lumbar spondylosis noted.
IMPRESSION: New fat stranding in the lower quadrants of the abdomen is more
notable on the left. The finding is nonspecific and could be related
to mesenteritis possibly very mild volume overload. Fat stranding is
more notable in the left lower quadrant but does not appear
associated with the colon as seen in diverticulitis.

Calcific and aortic coronary atherosclerosis with ectasia of the
descending abdominal aorta. Ectatic abdominal aorta at risk for
aneurysm development. Recommend followup by [place] in 5 years. This
recommendation follows ACR consensus guidelines: White Paper of the
ACR Incidental Findings Committee II on Vascular Findings. [HOSPITAL] 0838; [DATE].

Bilateral fat containing inguinal hernias.

Chronic renal disease.

Small, chronic left pleural effusion.

Findings compatible with esophageal dysmotility and/or reflux
disease.
